# Patient Record
Sex: Female | Born: 1945
Health system: Southern US, Community
[De-identification: ages and names within clinical notes are randomized; demographics above are authoritative.]

## PROBLEM LIST (undated history)

## (undated) DIAGNOSIS — Z79890 Hormone replacement therapy (postmenopausal): Secondary | ICD-10-CM

## (undated) DIAGNOSIS — E119 Type 2 diabetes mellitus without complications: Secondary | ICD-10-CM

## (undated) DIAGNOSIS — I10 Essential (primary) hypertension: Secondary | ICD-10-CM

## (undated) DIAGNOSIS — K8689 Other specified diseases of pancreas: Secondary | ICD-10-CM

## (undated) DIAGNOSIS — D136 Benign neoplasm of pancreas: Secondary | ICD-10-CM

## (undated) DIAGNOSIS — H6123 Impacted cerumen, bilateral: Secondary | ICD-10-CM

## (undated) DIAGNOSIS — K869 Disease of pancreas, unspecified: Secondary | ICD-10-CM

## (undated) DIAGNOSIS — R112 Nausea with vomiting, unspecified: Secondary | ICD-10-CM

## (undated) DIAGNOSIS — R7302 Impaired glucose tolerance (oral): Secondary | ICD-10-CM

## (undated) DIAGNOSIS — R197 Diarrhea, unspecified: Secondary | ICD-10-CM

## (undated) DIAGNOSIS — K625 Hemorrhage of anus and rectum: Secondary | ICD-10-CM

## (undated) DIAGNOSIS — Z9889 Other specified postprocedural states: Secondary | ICD-10-CM

## (undated) DIAGNOSIS — Z780 Asymptomatic menopausal state: Secondary | ICD-10-CM

## (undated) DIAGNOSIS — E785 Hyperlipidemia, unspecified: Secondary | ICD-10-CM

## (undated) DIAGNOSIS — J029 Acute pharyngitis, unspecified: Secondary | ICD-10-CM

## (undated) DIAGNOSIS — G43909 Migraine, unspecified, not intractable, without status migrainosus: Secondary | ICD-10-CM

## (undated) DIAGNOSIS — E7800 Pure hypercholesterolemia, unspecified: Secondary | ICD-10-CM

## (undated) DIAGNOSIS — Z460 Encounter for fitting and adjustment of spectacles and contact lenses: Secondary | ICD-10-CM

## (undated) DIAGNOSIS — Z5189 Encounter for other specified aftercare: Secondary | ICD-10-CM

## (undated) DIAGNOSIS — R319 Hematuria, unspecified: Secondary | ICD-10-CM

## (undated) HISTORY — DX: Pure hypercholesterolemia, unspecified: E78.00

## (undated) HISTORY — DX: Hormone replacement therapy: Z79.890

## (undated) HISTORY — DX: Benign neoplasm of pancreas: D13.6

## (undated) HISTORY — PX: COLONOSCOPY: SHX174

## (undated) HISTORY — DX: Hyperlipidemia, unspecified: E78.5

## (undated) HISTORY — DX: Hemorrhage of anus and rectum: K62.5

## (undated) HISTORY — DX: Encounter for other specified aftercare: Z51.89

## (undated) HISTORY — DX: Disease of pancreas, unspecified: K86.9

## (undated) HISTORY — DX: Migraine, unspecified, not intractable, without status migrainosus: G43.909

## (undated) HISTORY — DX: Impaired glucose tolerance (oral): R73.02

## (undated) HISTORY — DX: Essential (primary) hypertension: I10

## (undated) HISTORY — DX: Hematuria, unspecified: R31.9

## (undated) HISTORY — DX: Impacted cerumen, bilateral: H61.23

## (undated) HISTORY — DX: Acute pharyngitis, unspecified: J02.9

## (undated) HISTORY — DX: Other specified diseases of pancreas: K86.89

## (undated) HISTORY — PX: OVARIAN CYST REMOVAL: SHX89

## (undated) HISTORY — DX: Type 2 diabetes mellitus without complications: E11.9

## (undated) HISTORY — DX: Diarrhea, unspecified: R19.7

## (undated) HISTORY — PX: SPLENECTOMY: SUR1306

## (undated) HISTORY — DX: Encounter for fitting and adjustment of spectacles and contact lenses: Z46.0

## (undated) HISTORY — DX: Asymptomatic menopausal state: Z78.0

---
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
## 1998-01-19 ENCOUNTER — Other Ambulatory Visit: Admission: RE | Admit: 1998-01-19 | Discharge: 1998-01-19 | Payer: Self-pay | Admitting: *Deleted

## 1999-01-04 ENCOUNTER — Other Ambulatory Visit: Admission: RE | Admit: 1999-01-04 | Discharge: 1999-01-04 | Payer: Self-pay | Admitting: *Deleted

## 2000-01-05 ENCOUNTER — Other Ambulatory Visit: Admission: RE | Admit: 2000-01-05 | Discharge: 2000-01-05 | Payer: Self-pay | Admitting: *Deleted

## 2001-02-18 ENCOUNTER — Other Ambulatory Visit: Admission: RE | Admit: 2001-02-18 | Discharge: 2001-02-18 | Payer: Self-pay | Admitting: *Deleted

## 2002-05-26 ENCOUNTER — Other Ambulatory Visit: Admission: RE | Admit: 2002-05-26 | Discharge: 2002-05-26 | Payer: Self-pay | Admitting: *Deleted

## 2003-06-08 ENCOUNTER — Other Ambulatory Visit: Admission: RE | Admit: 2003-06-08 | Discharge: 2003-06-08 | Payer: Self-pay | Admitting: *Deleted

## 2004-09-13 ENCOUNTER — Other Ambulatory Visit: Admission: RE | Admit: 2004-09-13 | Discharge: 2004-09-13 | Payer: Self-pay | Admitting: *Deleted

## 2005-12-05 ENCOUNTER — Other Ambulatory Visit: Admission: RE | Admit: 2005-12-05 | Discharge: 2005-12-05 | Payer: Self-pay | Admitting: *Deleted

## 2007-02-26 ENCOUNTER — Other Ambulatory Visit: Admission: RE | Admit: 2007-02-26 | Discharge: 2007-02-26 | Payer: Self-pay | Admitting: *Deleted

## 2008-02-27 ENCOUNTER — Encounter: Payer: Self-pay | Admitting: Obstetrics and Gynecology

## 2008-02-27 ENCOUNTER — Ambulatory Visit: Payer: Self-pay | Admitting: Obstetrics and Gynecology

## 2008-02-27 ENCOUNTER — Other Ambulatory Visit: Admission: RE | Admit: 2008-02-27 | Discharge: 2008-02-27 | Payer: Self-pay | Admitting: Obstetrics and Gynecology

## 2008-03-02 ENCOUNTER — Ambulatory Visit: Payer: Self-pay | Admitting: Obstetrics and Gynecology

## 2010-06-20 ENCOUNTER — Ambulatory Visit
Admission: RE | Admit: 2010-06-20 | Discharge: 2010-06-20 | Payer: Self-pay | Source: Home / Self Care | Attending: Obstetrics and Gynecology | Admitting: Obstetrics and Gynecology

## 2010-06-20 ENCOUNTER — Other Ambulatory Visit
Admission: RE | Admit: 2010-06-20 | Discharge: 2010-06-20 | Payer: Self-pay | Source: Home / Self Care | Admitting: Obstetrics and Gynecology

## 2011-04-18 ENCOUNTER — Other Ambulatory Visit: Payer: Self-pay | Admitting: Family Medicine

## 2011-04-18 DIAGNOSIS — K869 Disease of pancreas, unspecified: Secondary | ICD-10-CM

## 2011-04-25 ENCOUNTER — Ambulatory Visit
Admission: RE | Admit: 2011-04-25 | Discharge: 2011-04-25 | Disposition: A | Discharge status: Home / Self Care | Payer: Medicare Other | Source: Ambulatory Visit | Attending: Family Medicine | Admitting: Family Medicine

## 2011-04-25 DIAGNOSIS — K869 Disease of pancreas, unspecified: Secondary | ICD-10-CM

## 2011-04-25 MED ORDER — GADOBENATE DIMEGLUMINE 529 MG/ML IV SOLN: 15.0000 mL | Freq: Once | INTRAVENOUS | Status: AC | PRN

## 2011-05-17 ENCOUNTER — Encounter (INDEPENDENT_AMBULATORY_CARE_PROVIDER_SITE_OTHER): Payer: Self-pay

## 2011-05-18 ENCOUNTER — Ambulatory Visit (INDEPENDENT_AMBULATORY_CARE_PROVIDER_SITE_OTHER): Payer: Medicare Other | Admitting: General Surgery

## 2011-05-22 ENCOUNTER — Encounter (INDEPENDENT_AMBULATORY_CARE_PROVIDER_SITE_OTHER): Payer: Self-pay

## 2011-05-23 ENCOUNTER — Other Ambulatory Visit (INDEPENDENT_AMBULATORY_CARE_PROVIDER_SITE_OTHER): Payer: Self-pay | Admitting: General Surgery

## 2011-05-23 ENCOUNTER — Encounter (INDEPENDENT_AMBULATORY_CARE_PROVIDER_SITE_OTHER): Payer: Self-pay | Admitting: General Surgery

## 2011-05-23 ENCOUNTER — Ambulatory Visit (INDEPENDENT_AMBULATORY_CARE_PROVIDER_SITE_OTHER): Payer: Medicare Other | Admitting: General Surgery

## 2011-05-23 DIAGNOSIS — K649 Unspecified hemorrhoids: Secondary | ICD-10-CM | POA: Insufficient documentation

## 2011-05-23 DIAGNOSIS — D136 Benign neoplasm of pancreas: Secondary | ICD-10-CM

## 2011-05-23 DIAGNOSIS — K8689 Other specified diseases of pancreas: Secondary | ICD-10-CM

## 2011-05-23 DIAGNOSIS — C259 Malignant neoplasm of pancreas, unspecified: Secondary | ICD-10-CM

## 2011-05-23 DIAGNOSIS — K869 Disease of pancreas, unspecified: Secondary | ICD-10-CM

## 2011-05-23 HISTORY — DX: Benign neoplasm of pancreas: D13.6

## 2011-05-23 MED ORDER — HYDROCORTISONE 2.5 % RE CREA: TOPICAL_CREAM | Freq: Two times a day (BID) | RECTAL | Status: AC

## 2011-05-23 NOTE — Progress Notes (Signed)
Chief Complaint  Patient presents with  . Other    Eval pancreatic mass    HISTORY: Pt presented with what she thought was hematuria.  She was seen by urology and workup negative for issues with kidneys or bladder.  She underwent a CT, and was found to have a pancreatic body mass.  She has had no abdominal pain, no nausea, vomiting, early satiety, no glucose intolerance, no weight loss.  She has not has any problems with diarrhea.  She now thinks that maybe her hemorrhoids may have made her think she had hematuria.  She has undergone an MR to eval the mass, and this was 2.7 cystic lesion in the body.  This favors a mucinous cystic neoplasm.    Past Medical History  Diagnosis Date  . Hyperlipidemia   . Hypertension   . Hypercholesterolemia   . Pharyngitis, acute   . Pancreatic lesion     found on MRI  . Rectal bleeding   . Hemorrhoids   . Blood in urine   . Contact lens/glasses fitting   . Menopause   . Hormone replacement therapy (postmenopausal)   . Pancreatic mass     Past Surgical History  Procedure Date  . Ovarian cyst removal 2002 - approximate  . Tonsillectomy 1952  . Colonoscopy     Current Outpatient Prescriptions  Medication Sig Dispense Refill  . Calcium Citrate-Vitamin D (CALCIUM CITRATE + D PO) Take by mouth as needed. Vitamin d 500 iu; calcium 630 mg       . Multiple Vitamins-Minerals (CENTURY SENIOR PO) Take by mouth as needed.        . hydrochlorothiazide (HYDRODIURIL) 25 MG tablet Take 25 mg by mouth daily.        Marland Kitchen lisinopril (PRINIVIL,ZESTRIL) 40 MG tablet Take 40 mg by mouth daily.        . simvastatin (ZOCOR) 40 MG tablet Take 40 mg by mouth at bedtime.           Allergies  Allergen Reactions  . Codeine Nausea And Vomiting  . Sulfa Antibiotics Hives  . Penicillins Other (See Comments)    Told since she was a child not to take.     Family History  Problem Relation Age of Onset  . Cancer Mother     pancreatic  . Other Father     respiration  problems, bleeding ulcers, cardiac     History   Social History  . Marital Status: Widowed    Spouse Name: N/A    Number of Children: N/A  . Years of Education: N/A   Social History Main Topics  . Smoking status: Never Smoker   . Smokeless tobacco: Never Used  . Alcohol Use: Yes     rarely  . Drug Use: No  . Sexually Active: None   Other Topics Concern  . None   Social History Narrative  . None   REVIEW OF SYSTEMS - PERTINENT POSITIVES ONLY: 12 point review of systems negative other than HPI and PMH except for rectal bleeding.    EXAM: Filed Vitals:   05/23/11 1330  BP: 106/74  Pulse: 68  Temp: 97.9 F (36.6 C)  Resp: 18    Gen:  No acute distress.  Well nourished and well groomed.   Neurological: Alert and oriented to person, place, and time. Coordination normal.  Head: Normocephalic and atraumatic.  Eyes: Conjunctivae are normal. Pupils are equal, round, and reactive to light. No scleral icterus.  Neck: Normal range of  motion. Neck supple. No tracheal deviation or thyromegaly present.  Cardiovascular: Normal rate, regular rhythm, normal heart sounds and intact distal pulses.  Exam reveals no gallop and no friction rub.  No murmur heard. Respiratory: Effort normal.  No respiratory distress. No chest wall tenderness. Breath sounds normal.  No wheezes, rales or rhonchi.  GI: Soft. Bowel sounds are normal. The abdomen is soft and nontender.  There is no rebound and no guarding.  Musculoskeletal: Normal range of motion. Extremities are nontender.  Lymphadenopathy: No cervical, preauricular, postauricular or axillary adenopathy is present Skin: Skin is warm and dry. No rash noted. No diaphoresis. No erythema. No pallor. No clubbing, cyanosis, or edema.   Psychiatric: Normal mood and affect. Behavior is normal. Judgment and thought content normal.    LABORATORY RESULTS: Available labs are reviewed    RADIOLOGY RESULTS: MR IMPRESSION:  2.7 cm nonenhancing cystic  lesion in the pancreatic body, as  described above. The appearance favors a mucinous cystic neoplasm,  less likely a pseudocyst if there is documented prior history of  pancreatitis.  Pancreatic divisum.  Cholelithiasis, without associated findings to suggest acute  cholecystitis.   ASSESSMENT AND PLAN: Pancreatic mass Cystic mass of pancreas with septations. Mother of pt had pancreatic cancer. This was discovered incidentally, pt asymptomatic. Tumor markers, referral to GI for EUS. If high fluid amylase, this would be consistent with pseudocyst, and we could watch. If low fluid amylase, high CEA, or atypical cytology, we would remove distal pancreas.    Will follow up to discuss    Maudry Diego MD Surgical Oncology, General and Endocrine Surgery [facility]   Visit Diagnoses: 1. Pancreatic mass   2. Hemorrhoids     Primary Care Physician: Gretel Acre, MD

## 2011-05-23 NOTE — Patient Instructions (Addendum)
Follow up after EUS appointment.  If EUS is after our appt, call and move appointment with me.     Hemorrhoids  Hemorrhoids are enlarged (dilated) veins around the rectum. There are 2 types of hemorrhoids, and the type of hemorrhoid is determined by its location. Internal hemorrhoids occur in the veins just inside the rectum.They are usually not painful, but they may bleed.However, they may poke through to the outside and become irritated and painful. External hemorrhoids involve the veins outside the anus and can be felt as a painful swelling or hard lump near the anus.They are often itchy and may crack and bleed. Sometimes clots will form in the veins. This makes them swollen and painful. These are called thrombosed hemorrhoids. CAUSES Causes of hemorrhoids include:  Pregnancy. This increases the pressure in the hemorrhoidal veins.   Constipation.   Straining to have a bowel movement.   Obesity.   Heavy lifting or other activity that caused you to strain.   TREATMENT Most of the time hemorrhoids improve in 1 to 2 weeks. However, if symptoms do not seem to be getting better or if you have a lot of rectal bleeding, your caregiver may perform a procedure to help make the hemorrhoids get smaller or remove them completely.Possible treatments include:  Rubber band ligation. A rubber band is placed at the base of the hemorrhoid to cut off the circulation.   Sclerotherapy. A chemical is injected to shrink the hemorrhoid.   Infrared light therapy. Tools are used to burn the hemorrhoid.   Hemorrhoidectomy. This is surgical removal of the hemorrhoid.   HOME CARE INSTRUCTIONS   Increase fiber in your diet. Ask your caregiver about using fiber supplements.   Drink enough water and fluids to keep your urine clear or pale yellow.   Exercise regularly.   Go to the bathroom when you have the urge to have a bowel movement. Do not wait.   Avoid straining to have bowel movements.   Keep  the anal area dry and clean.   Only take over-the-counter or prescription medicines for pain, discomfort, or fever as directed by your caregiver.   Take warm sitz baths for 20 to 30 minutes, 3 to 4 times per day.   If the hemorrhoids are very tender and swollen, place ice packs on the area as tolerated. Using ice packs between sitz baths may be helpful. Fill a plastic bag with ice. Place a towel between the bag of ice and your skin.   Medicated creams and suppositories may be used or applied as directed.   Do not use a donut-shaped pillow or sit on the toilet for long periods. This increases blood pooling and pain.   SEEK MEDICAL CARE IF:   You have increasing pain and swelling that is not controlled with your medicine.   You have uncontrolled bleeding.   You have difficulty or you are unable to have a bowel movement.   You have pain or inflammation outside the area of the hemorrhoids.   You have chills or an oral temperature above 102 F (38.9 C).

## 2011-05-23 NOTE — Assessment & Plan Note (Signed)
Cystic mass of pancreas with septations. Mother of pt had pancreatic cancer. This was discovered incidentally, pt asymptomatic. Tumor markers, referral to GI for EUS. If high fluid amylase, this would be consistent with pseudocyst, and we could watch. If low fluid amylase, high CEA, or atypical cytology, we would remove distal pancreas.    Will follow up to discuss

## 2011-05-24 NOTE — Progress Notes (Signed)
Quick Note:  Please let pt know value of tumor marker is low, which is good. ______

## 2011-05-25 ENCOUNTER — Other Ambulatory Visit: Payer: Self-pay | Admitting: Gastroenterology

## 2011-05-25 ENCOUNTER — Telehealth: Payer: Self-pay

## 2011-05-25 DIAGNOSIS — K862 Cyst of pancreas: Secondary | ICD-10-CM

## 2011-05-25 NOTE — Telephone Encounter (Signed)
Message copied by Donata Duff on Thu May 25, 2011 11:09 AM ------      Message from: Rob Bunting P      Created: Thu May 25, 2011 10:25 AM      Regarding: RE: pt need EUS scheduled       OK,      Almena Hokenson, please schedule her for upper EUS radial +/- linear, , next  Available EUS time at [facility] (does not need propofol).  For pancreatic cyst.  Thanks                        ----- Message -----         From: Chales Abrahams, CMA         Sent: 05/24/2011   8:08 AM           To: Rob Bunting, MD      Subject: FW: pt need EUS scheduled                                            ----- Message -----         From: Erin Sons         Sent: 05/23/2011   4:51 PM           To: Chales Abrahams, CMA      Subject: pt need EUS scheduled                                    Pt need EUS appt scheduled for panc mass.            Thanks      Erin Sons

## 2011-05-25 NOTE — Telephone Encounter (Signed)
Left message on machine to call back  

## 2011-05-25 NOTE — Telephone Encounter (Signed)
Pt has been scheduled for a EUS need to explain and review meds

## 2011-05-25 NOTE — Telephone Encounter (Signed)
Pt aware she has been instructed and meds reviewed.  She will call with any further questions or concerns

## 2011-06-01 ENCOUNTER — Telehealth (INDEPENDENT_AMBULATORY_CARE_PROVIDER_SITE_OTHER): Payer: Self-pay

## 2011-06-01 NOTE — Telephone Encounter (Signed)
Pt aware of cancer antigen results.  She has appt with Dr. Christella Hartigan for her endoscopy.

## 2011-06-08 ENCOUNTER — Encounter (INDEPENDENT_AMBULATORY_CARE_PROVIDER_SITE_OTHER): Payer: Self-pay

## 2011-06-14 ENCOUNTER — Encounter (HOSPITAL_COMMUNITY): Payer: Self-pay | Admitting: *Deleted

## 2011-06-15 ENCOUNTER — Ambulatory Visit (HOSPITAL_COMMUNITY)
Admission: RE | Admit: 2011-06-15 | Discharge: 2011-06-15 | Disposition: A | Discharge status: Home / Self Care | Payer: Medicare Other | Source: Ambulatory Visit | Attending: Gastroenterology | Admitting: Gastroenterology

## 2011-06-15 ENCOUNTER — Encounter (HOSPITAL_COMMUNITY): Payer: Self-pay

## 2011-06-15 ENCOUNTER — Other Ambulatory Visit: Payer: Self-pay | Admitting: Gastroenterology

## 2011-06-15 ENCOUNTER — Encounter (HOSPITAL_COMMUNITY)
Admission: RE | Disposition: A | Discharge status: Home / Self Care | Payer: Self-pay | Source: Ambulatory Visit | Attending: Gastroenterology

## 2011-06-15 DIAGNOSIS — K862 Cyst of pancreas: Secondary | ICD-10-CM | POA: Insufficient documentation

## 2011-06-15 DIAGNOSIS — K863 Pseudocyst of pancreas: Secondary | ICD-10-CM | POA: Insufficient documentation

## 2011-06-15 DIAGNOSIS — Z79899 Other long term (current) drug therapy: Secondary | ICD-10-CM | POA: Insufficient documentation

## 2011-06-15 DIAGNOSIS — I10 Essential (primary) hypertension: Secondary | ICD-10-CM | POA: Insufficient documentation

## 2011-06-15 DIAGNOSIS — K869 Disease of pancreas, unspecified: Secondary | ICD-10-CM

## 2011-06-15 DIAGNOSIS — E785 Hyperlipidemia, unspecified: Secondary | ICD-10-CM | POA: Insufficient documentation

## 2011-06-15 DIAGNOSIS — E7800 Pure hypercholesterolemia, unspecified: Secondary | ICD-10-CM | POA: Insufficient documentation

## 2011-06-15 DIAGNOSIS — K449 Diaphragmatic hernia without obstruction or gangrene: Secondary | ICD-10-CM | POA: Insufficient documentation

## 2011-06-15 DIAGNOSIS — K8689 Other specified diseases of pancreas: Secondary | ICD-10-CM

## 2011-06-15 HISTORY — DX: Other specified postprocedural states: R11.2

## 2011-06-15 HISTORY — DX: Other specified postprocedural states: Z98.890

## 2011-06-15 HISTORY — PX: EUS: SHX5427

## 2011-06-15 SURGERY — UPPER ENDOSCOPIC ULTRASOUND (EUS) LINEAR
Anesthesia: Moderate Sedation

## 2011-06-15 MED ORDER — BUTAMBEN-TETRACAINE-BENZOCAINE 2-2-14 % EX AERO
INHALATION_SPRAY | CUTANEOUS | Status: DC | PRN
  Administered 2011-06-15: 2 via TOPICAL

## 2011-06-15 MED ORDER — FENTANYL CITRATE 0.05 MG/ML IJ SOLN
INTRAMUSCULAR | Status: AC
  Filled 2011-06-15: qty 2

## 2011-06-15 MED ORDER — CIPROFLOXACIN IN D5W 400 MG/200ML IV SOLN: 400.0000 mg | Freq: Two times a day (BID) | INTRAVENOUS | Status: DC

## 2011-06-15 MED ORDER — CIPROFLOXACIN IN D5W 400 MG/200ML IV SOLN
INTRAVENOUS | Status: AC
  Filled 2011-06-15: qty 200

## 2011-06-15 MED ORDER — FENTANYL CITRATE 0.05 MG/ML IJ SOLN
INTRAMUSCULAR | Status: DC | PRN
  Administered 2011-06-15: 10 ug via INTRAVENOUS
  Administered 2011-06-15 (×2): 25 ug via INTRAVENOUS
  Administered 2011-06-15: 15 ug via INTRAVENOUS

## 2011-06-15 MED ORDER — DIPHENHYDRAMINE HCL 50 MG/ML IJ SOLN
INTRAMUSCULAR | Status: AC
  Filled 2011-06-15: qty 1

## 2011-06-15 MED ORDER — MIDAZOLAM HCL 10 MG/2ML IJ SOLN
INTRAMUSCULAR | Status: DC | PRN
  Administered 2011-06-15: 1.5 mg via INTRAVENOUS
  Administered 2011-06-15 (×2): 2 mg via INTRAVENOUS
  Administered 2011-06-15 (×2): 1 mg via INTRAVENOUS

## 2011-06-15 MED ORDER — METRONIDAZOLE 500 MG PO TABS: 500.0000 mg | ORAL_TABLET | Freq: Three times a day (TID) | ORAL | Status: AC

## 2011-06-15 MED ORDER — MIDAZOLAM HCL 2 MG/2ML IJ SOLN
INTRAMUSCULAR | Status: AC
  Filled 2011-06-15: qty 2

## 2011-06-15 MED ORDER — MIDAZOLAM HCL 10 MG/2ML IJ SOLN
INTRAMUSCULAR | Status: AC
  Filled 2011-06-15: qty 2

## 2011-06-15 MED ORDER — CIPROFLOXACIN IN D5W 400 MG/200ML IV SOLN
400.0000 mg | Freq: Once | INTRAVENOUS | Status: AC
  Administered 2011-06-15: 400 mg via INTRAVENOUS

## 2011-06-15 MED ORDER — DIPHENHYDRAMINE HCL 50 MG/ML IJ SOLN: 25.0000 mg | Freq: Once | INTRAMUSCULAR | Status: DC

## 2011-06-15 MED ORDER — SODIUM CHLORIDE 0.9 % IV SOLN
INTRAVENOUS | Status: DC
  Administered 2011-06-15 (×2): 500 mL via INTRAVENOUS

## 2011-06-15 MED ORDER — CIPROFLOXACIN HCL 500 MG PO TABS: 500.0000 mg | ORAL_TABLET | Freq: Two times a day (BID) | ORAL | Status: DC

## 2011-06-15 NOTE — Interval H&P Note (Signed)
History and Physical Interval Note:  06/15/2011 2:47 PM  Valerie Lawson  has presented today for surgery, with the diagnosis of Pancreatic cyst [577.2]  The various methods of treatment have been discussed with the patient and family. After consideration of risks, benefits and other options for treatment, the patient has consented to  Procedure(s): UPPER ENDOSCOPIC ULTRASOUND (EUS) LINEAR as a surgical intervention .  The patients' history has been reviewed, patient examined, no change in status, stable for surgery.  I have reviewed the patients' chart and labs.  Questions were answered to the patient's satisfaction.     Rob Bunting

## 2011-06-15 NOTE — H&P (View-Only) (Signed)
Chief Complaint  Patient presents with  . Other    Eval pancreatic mass    HISTORY: Pt presented with what she thought was hematuria.  She was seen by urology and workup negative for issues with kidneys or bladder.  She underwent a CT, and was found to have a pancreatic body mass.  She has had no abdominal pain, no nausea, vomiting, early satiety, no glucose intolerance, no weight loss.  She has not has any problems with diarrhea.  She now thinks that maybe her hemorrhoids may have made her think she had hematuria.  She has undergone an MR to eval the mass, and this was 2.7 cystic lesion in the body.  This favors a mucinous cystic neoplasm.    Past Medical History  Diagnosis Date  . Hyperlipidemia   . Hypertension   . Hypercholesterolemia   . Pharyngitis, acute   . Pancreatic lesion     found on MRI  . Rectal bleeding   . Hemorrhoids   . Blood in urine   . Contact lens/glasses fitting   . Menopause   . Hormone replacement therapy (postmenopausal)   . Pancreatic mass     Past Surgical History  Procedure Date  . Ovarian cyst removal 2002 - approximate  . Tonsillectomy 1952  . Colonoscopy     Current Outpatient Prescriptions  Medication Sig Dispense Refill  . Calcium Citrate-Vitamin D (CALCIUM CITRATE + D PO) Take by mouth as needed. Vitamin d 500 iu; calcium 630 mg       . Multiple Vitamins-Minerals (CENTURY SENIOR PO) Take by mouth as needed.        . hydrochlorothiazide (HYDRODIURIL) 25 MG tablet Take 25 mg by mouth daily.        . lisinopril (PRINIVIL,ZESTRIL) 40 MG tablet Take 40 mg by mouth daily.        . simvastatin (ZOCOR) 40 MG tablet Take 40 mg by mouth at bedtime.           Allergies  Allergen Reactions  . Codeine Nausea And Vomiting  . Sulfa Antibiotics Hives  . Penicillins Other (See Comments)    Told since she was a child not to take.     Family History  Problem Relation Age of Onset  . Cancer Mother     pancreatic  . Other Father     respiration  problems, bleeding ulcers, cardiac     History   Social History  . Marital Status: Widowed    Spouse Name: N/A    Number of Children: N/A  . Years of Education: N/A   Social History Main Topics  . Smoking status: Never Smoker   . Smokeless tobacco: Never Used  . Alcohol Use: Yes     rarely  . Drug Use: No  . Sexually Active: None   Other Topics Concern  . None   Social History Narrative  . None   REVIEW OF SYSTEMS - PERTINENT POSITIVES ONLY: 12 point review of systems negative other than HPI and PMH except for rectal bleeding.    EXAM: Filed Vitals:   05/23/11 1330  BP: 106/74  Pulse: 68  Temp: 97.9 F (36.6 C)  Resp: 18    Gen:  No acute distress.  Well nourished and well groomed.   Neurological: Alert and oriented to person, place, and time. Coordination normal.  Head: Normocephalic and atraumatic.  Eyes: Conjunctivae are normal. Pupils are equal, round, and reactive to light. No scleral icterus.  Neck: Normal range of   motion. Neck supple. No tracheal deviation or thyromegaly present.  Cardiovascular: Normal rate, regular rhythm, normal heart sounds and intact distal pulses.  Exam reveals no gallop and no friction rub.  No murmur heard. Respiratory: Effort normal.  No respiratory distress. No chest wall tenderness. Breath sounds normal.  No wheezes, rales or rhonchi.  GI: Soft. Bowel sounds are normal. The abdomen is soft and nontender.  There is no rebound and no guarding.  Musculoskeletal: Normal range of motion. Extremities are nontender.  Lymphadenopathy: No cervical, preauricular, postauricular or axillary adenopathy is present Skin: Skin is warm and dry. No rash noted. No diaphoresis. No erythema. No pallor. No clubbing, cyanosis, or edema.   Psychiatric: Normal mood and affect. Behavior is normal. Judgment and thought content normal.    LABORATORY RESULTS: Available labs are reviewed    RADIOLOGY RESULTS: MR IMPRESSION:  2.7 cm nonenhancing cystic  lesion in the pancreatic body, as  described above. The appearance favors a mucinous cystic neoplasm,  less likely a pseudocyst if there is documented prior history of  pancreatitis.  Pancreatic divisum.  Cholelithiasis, without associated findings to suggest acute  cholecystitis.   ASSESSMENT AND PLAN: Pancreatic mass Cystic mass of pancreas with septations. Mother of pt had pancreatic cancer. This was discovered incidentally, pt asymptomatic. Tumor markers, referral to GI for EUS. If high fluid amylase, this would be consistent with pseudocyst, and we could watch. If low fluid amylase, high CEA, or atypical cytology, we would remove distal pancreas.    Will follow up to discuss    Dervin Vore L Malaijah Houchen MD Surgical Oncology, General and Endocrine Surgery [facility]   Visit Diagnoses: 1. Pancreatic mass   2. Hemorrhoids     Primary Care Physician: Adaku Nnodi, MD    

## 2011-06-15 NOTE — Op Note (Signed)
[facility] [address] [place], [place]  [address]  ENDOSCOPIC ULTRASOUND PROCEDURE REPORT  PATIENT:  Valerie Lawson, Valerie Lawson  MR#:  [id number] BIRTHDATE:  [birth year]  GENDER:  female ENDOSCOPIST:  Rachael Fee, MD REFERRED BY:  Almond Lint, M.D. PROCEDURE DATE:  06/15/2011 PROCEDURE:  Upper EUS w/FNA ASA CLASS:  Class II INDICATIONS:  incidentally noted cyst in body of pancreas; mother died of pancreatic cancer MEDICATIONS:  Fentanyl 75 mcg IV, Versed 7.5 mg IV, cipro 400mg  IV DESCRIPTION OF PROCEDURE:   After the risks benefits and alternatives of the procedure were  explained, informed consent was obtained. The patient was then placed in the left, lateral, decubitus postion and IV sedation was administered. Throughout the procedure, the patient's blood pressure, pulse and oxygen saturations were monitored continuously.  Under direct visualization, the  endoscope was introduced through the mouth and advanced to the second portion of the duodenum.  Water was used as necessary to provide an acoustic interface.  Upon completion of the imaging, water was removed and the patient was sent to the recovery room in satisfactory condition. <<PROCEDUREIMAGES>>  Endoscopic findings: 1. Small hiatal hernia 2. Otherwise normal UGI tract (limited views with echoendoscopes)  EUS findings: 1. 2.7cm by 2.0cm anechoic cyst in body of pancreas that does not appear to communicate with the main pancreatic duct. There are no associated solid masses or nodules and no septea within the cyst. The fluid was completely aspirated using a single pass with a 22 gauge BS EUS FNA needle.  6cc of clear, thin fluid was aspirated and sent for review. 2. Pancreatic parenchyma was otherwise normal 3. Main pancreatic duct was normal, non-dilated 4. Shadowing stones are within the gallbladder 5. CBD was normal; non-dilated and no stones.  Impression: 2.7cm by 2.0cm simple cyst in body of pancreas  without signs of malignancy.  The fluid from the cyst was completely aspirated and sent for CEA, amylase and cytology.  If the fluid is clearly innocent (negative cytology, low CEA) then would simply follow her clinically. If the fluid is clearly concerning or even if "gray area" I would recommend resection given her family history (mother had pancreatic cancer).  She will complete 3 days of cipro.  ______________________________ Rachael Fee, MD  n. eSIGNED:   Rachael Fee at 06/15/2011 03:49 PM  Dedra Skeens, [id number]

## 2011-06-16 ENCOUNTER — Encounter (HOSPITAL_COMMUNITY): Payer: Self-pay

## 2011-06-16 ENCOUNTER — Encounter (HOSPITAL_COMMUNITY): Payer: Self-pay | Admitting: Gastroenterology

## 2011-06-16 LAB — PANC CYST FLD ANLYS-PATHFNDR-TG

## 2011-06-23 ENCOUNTER — Telehealth: Payer: Self-pay | Admitting: Gastroenterology

## 2011-06-23 NOTE — Telephone Encounter (Signed)
i have left messages the past 2 days.  No call back  CEA 4.5 ng/mL Amylase 14,918 U/L Cytology; "some atypical cells"  Given her family history, mother died of panc cancer, Dr. Donell Beers and I agree that she should consider resection since there were some atypical cells in the cytology.  I hope to discuss this with her soon, await her call back.

## 2011-06-23 NOTE — Telephone Encounter (Signed)
We spoke about the cyst fluid test results. She understands that Dr. Arita Miss office will be getting in touch with her to discuss surgical resection.

## 2011-06-27 ENCOUNTER — Telehealth (INDEPENDENT_AMBULATORY_CARE_PROVIDER_SITE_OTHER): Payer: Self-pay

## 2011-06-27 ENCOUNTER — Encounter (INDEPENDENT_AMBULATORY_CARE_PROVIDER_SITE_OTHER): Payer: Medicare Other | Admitting: General Surgery

## 2011-06-27 ENCOUNTER — Encounter: Payer: Self-pay | Admitting: Gastroenterology

## 2011-06-29 NOTE — Telephone Encounter (Signed)
Close encounter 

## 2011-07-03 ENCOUNTER — Ambulatory Visit (INDEPENDENT_AMBULATORY_CARE_PROVIDER_SITE_OTHER): Payer: Medicare Other | Admitting: General Surgery

## 2011-07-03 ENCOUNTER — Encounter (INDEPENDENT_AMBULATORY_CARE_PROVIDER_SITE_OTHER): Payer: Self-pay | Admitting: General Surgery

## 2011-07-03 DIAGNOSIS — K8689 Other specified diseases of pancreas: Secondary | ICD-10-CM

## 2011-07-03 DIAGNOSIS — K869 Disease of pancreas, unspecified: Secondary | ICD-10-CM

## 2011-07-03 DIAGNOSIS — K8020 Calculus of gallbladder without cholecystitis without obstruction: Secondary | ICD-10-CM | POA: Insufficient documentation

## 2011-07-03 NOTE — Assessment & Plan Note (Signed)
Plan lap chole in conjunction with above procedure.  The surgical procedure was described to the patient in detail.  The patient was given Agricultural engineer. .  I discussed the incision type and location, the location of the gallbladder, the anatomy of the bile ducts and arteries, and the typical progression of surgery.  I discussed the possibility of converting to an open operation.  I advised of the risks of bleeding, infection, damage to other structures (such as the bile duct, intestine or liver), bile leak, need for other procedures or surgeries, and post op diarrhea/constipation.  We discussed the risk of blood clot.  We discussed the recovery period and post operative restrictions.  The patient was advised against taking blood thinners the week before surgery.

## 2011-07-03 NOTE — Patient Instructions (Signed)
Pancreas surgery.  Care After Refer to this sheet in the next few weeks. These instructions provide you with information on caring for yourself after your procedure. Your caregiver may also give you more specific instructions. Your treatment has been planned according to current medical practices, but problems sometimes occur. Call your caregiver if you have any problems or questions after your procedure.  POSSIBLE COMPLICATIONS This is a very extensive operation and includes complications listed below: Bleeding Infection and possible wound complications such as hernia Damage to adjacent structures Leak of surgical connections, the worst of which is the connection between the intestine and the pancreas (20%) Possible need for other procedures, such as abscess drains in radiology or endoscopy.   Possible prolonged hospital stay Possible development of diabetes or worsening of current diabetes. (5-20%) Possible diarrhea from lack of pancreatic enzymes.   Prolonged fatigue/weakness/appetite MOST PATIENTS' ENERGY LEVEL IS NOT BACK TO NORMAL FOR AT LEAST 2-3 MONTHS.  OLDER PATIENTS MAY FEEL WEAK FOR LONGER PERIODS OF TIME.   Difficulty with eating or post operative nausea (around 30%) Possible early recurrence of cancer Possible complications of your medical problems such as heart disease or arrhythmias.  HOME CARE INSTRUCTIONS  Medicines  Take pain medicine as prescribed by your caregiver. Do not take any over-the-counter pain medicines unless your caregiver says it is okay. Some pain medicines can cause bleeding for several weeks after surgery.   Constipation is common after this procedure. You may need to take medicine to prevent this.   Some people have trouble digesting food after a  Partial pancreatectomy. Your caregiver may give you medicine to help with digestion. Let us know if you are having diarrhea.    Wound care  You may have drainage tubes still in place when you go home.  These tubes need to stay in place until no more fluid is draining from your body. Your caregiver will explain what you need to do. Follow the instructions carefully. Note the daily amount of drainage and color of the fluid in the drain. Be sure to ask when you should return to have the tubes taken out.   You may need to go back to have your stitches (sutures) or staples taken out. Make sure you know when to do that.   Carefully check your surgical cut (incision) area every day. Make sure there are no signs of infection, such as:   Pain.   Swelling.   Redness.   Warmth.   An opening of the incision.   Bleeding or leaking fluid.   Keep the incision area dry. Do not use lotions or creams unless your caregiver tells you to.  Diet  You may not feel like eating for a while. This is normal and may last for a few weeks. When you are able to eat, try to eat:   Fruits and vegetables.   Foods with lean protein. Examples are boneless and skinless chicken breasts, lean beef, egg whites, and seafood like tuna and shrimp.   Low-fat dairy products.   Foods that are high in fiber. Examples are whole grains, beans, most fruits, and nuts.   Do not eat foods that contain a lot of fat. They can be hard to digest.   Drink enough fluids to keep your urine clear or pale yellow. This helps prevent constipation.   Try eating small portions more often than 3 times a day. Do not skip meals.   Weigh yourself once a week. Wear the same amount of  clothes each time you weigh yourself.  Activities  Do not lift anything heavy for 6 weeks after your surgery. Do not lift anything heavier than 10 pounds (4.5 kg) until your caregiver says it is okay.   Try to walk 100 yards (90 meters) every day. Do not push yourself too hard. After a few weeks, start to slowly increase how far you walk.   You can take showers after your bandages are off. Do not take tub baths or swim until your caregiver says it is okay.    Do not drive if you are taking narcotic pain medicines.   Ask your caregiver whether it is okay for you do to certain activities, such as going back to work, driving a car, or having sex. It may be a few months before you can go back to all your normal activities.  Follow up  Keep all your appointments. This is how your caregiver can tell if you are getting better.   Call your caregiver with any questions.  SEEK MEDICAL CARE IF:  Your appetite does not get better.   You have nausea that will not go away.   You have constipation.   Your pain does not go away, even after taking pain medicine.   You become very thirsty, overly tired, dizzy, or you need to urinate often.  SEEK IMMEDIATE MEDICAL CARE IF:  You cannot eat.   You are vomiting.   You have very bad pain that is getting worse.   You are very tired.   You have very bad constipation or diarrhea.   Your skin around the incision or drainage tubes becomes swollen, red, or leaks blood or other fluid.   Your incision or drainage area hurts.   You notice a bad smell or a change in the color of fluid in the drainage tube.   Your incision starts to open.   You have a fever.   You have chest pain or difficulty breathing.  MAKE SURE YOU:  Understand these instructions.   Will watch your condition.

## 2011-07-03 NOTE — Assessment & Plan Note (Signed)
Pt has family history of pancreatic cancer, and high CEA values on EUS, solid component of tumor.  Will plan distal pancreatectomy.  Mass is in body of pancreas.  May need to open to get good surgical margin.  Discussed this with pt.   Reviewed risks of surgery including risk of becoming diabetic or having exocrine insufficiency.   Discussed possible infection, bleeding, damage to adjacent structures, need for prolonged drain placement, possible need for an additional drain. Discussed post splenectomy sepsis.   Reviewed vaccinations.   Discussed post op recovery and restrictions.  Pt wishes to proceed.

## 2011-07-03 NOTE — Progress Notes (Signed)
Chief Complaint  Patient presents with  . Mass    Pancreatic    HISTORY: Pt presents with history of gallstones and cystic mass in pancreas discovered on workup for hematuria.  . She underwent EUS and cyst aspiration.  The CEA was high, and the cytology was atypical.  She presents to discuss surgery.  She does have occasional abdominal bloating and has been avoiding fatty foods since her dx of gallstones several years ago.  She had a mother with pancreatic cancer and wants to be aggressive treating this pancreatic mass.    Past Medical History  Diagnosis Date  . Hyperlipidemia   . Hypercholesterolemia   . Pharyngitis, acute   . Pancreatic lesion     found on MRI  . Rectal bleeding   . Hemorrhoids   . Blood in urine   . Contact lens/glasses fitting   . Menopause   . Hormone replacement therapy (postmenopausal)     discontinued ten years ago  . Pancreatic mass     states mother died from pancreatic cancer  . PONV (postoperative nausea and vomiting)   . Hypertension     Past Surgical History  Procedure Date  . Ovarian cyst removal 2002 - approximate  . Tonsillectomy 1952  . Colonoscopy   . Cesarean section 1977, 1979    x2  . Eus 06/15/2011    Procedure: UPPER ENDOSCOPIC ULTRASOUND (EUS) LINEAR;  Surgeon: Rob Bunting, MD;  Location: [facility];  Service: Endoscopy;  Laterality: N/A;  radial linear    Current Outpatient Prescriptions  Medication Sig Dispense Refill  . Calcium Citrate-Vitamin D (CALCIUM CITRATE + D PO) Take 1 capsule by mouth as needed. Vitamin d 500 iu; calcium 630 mg      . hydrochlorothiazide (HYDRODIURIL) 25 MG tablet Take 25 mg by mouth daily.       Marland Kitchen lisinopril (PRINIVIL,ZESTRIL) 40 MG tablet Take 40 mg by mouth daily.       . Multiple Vitamins-Minerals (CENTURY SENIOR PO) Take 1 capsule by mouth daily.       . simvastatin (ZOCOR) 40 MG tablet Take 40 mg by mouth at bedtime.          Allergies  Allergen Reactions  . Codeine Nausea And Vomiting    . Sulfa Antibiotics Hives  . Ciprofloxacin     Rash   . Penicillins Other (See Comments)    Told since she was a child not to take.     Family History  Problem Relation Age of Onset  . Cancer Mother     pancreatic  . Other Father     respiration problems, bleeding ulcers, cardiac  . Anesthesia problems Neg Hx   . Hypotension Neg Hx   . Malignant hyperthermia Neg Hx   . Pseudochol deficiency Neg Hx      History   Social History  . Marital Status: Widowed    Spouse Name: N/A    Number of Children: N/A  . Years of Education: N/A   Social History Main Topics  . Smoking status: Never Smoker   . Smokeless tobacco: Never Used  . Alcohol Use: Yes     rarely  . Drug Use: No  . Sexually Active: None    REVIEW OF SYSTEMS - PERTINENT POSITIVES ONLY: 12 point review of systems negative other than HPI and PMH  EXAM: Filed Vitals:   07/03/11 1023  BP: 128/76  Pulse: 68  Temp: 98.1 F (36.7 C)  Resp: 16    Gen:  No acute distress.  Well nourished and well groomed.   Neurological: Alert and oriented to person, place, and time. Coordination normal.  Head: Normocephalic and atraumatic.  Eyes: Conjunctivae are normal. Pupils are equal, round, and reactive to light. No scleral icterus.   Cardiovascular: Normal rate, regular rhythm. Respiratory: Effort normal.  No respiratory distress. No chest wall tenderness.  Musculoskeletal: Normal range of motion.  Skin: Skin is warm and dry. No rash noted. No diaphoresis. No erythema. No pallor. No clubbing, cyanosis, or edema.   Psychiatric: Normal mood and affect. Behavior is normal. Judgment and thought content normal.    LABORATORY RESULTS: Available labs are reviewed, CA 19-9 normal   RADIOLOGY RESULTS: IMPRESSION:  2.7 cm nonenhancing cystic lesion in the pancreatic body, as  described above. The appearance favors a mucinous cystic neoplasm,  less likely a pseudocyst if there is documented prior history of   pancreatitis.  Pancreatic divisum.  Cholelithiasis, without associated findings to suggest acute  cholecystitis.     ASSESSMENT AND PLAN: 30 minutes spent in counseling regarding the operation . Cholelithiasis Plan lap chole in conjunction with above procedure.  The surgical procedure was described to the patient in detail.  The patient was given Agricultural engineer. .  I discussed the incision type and location, the location of the gallbladder, the anatomy of the bile ducts and arteries, and the typical progression of surgery.  I discussed the possibility of converting to an open operation.  I advised of the risks of bleeding, infection, damage to other structures (such as the bile duct, intestine or liver), bile leak, need for other procedures or surgeries, and post op diarrhea/constipation.  We discussed the risk of blood clot.  We discussed the recovery period and post operative restrictions.  The patient was advised against taking blood thinners the week before surgery.       Pancreatic mass Pt has family history of pancreatic cancer, and high CEA values on EUS, solid component of tumor.  Will plan distal pancreatectomy.  Mass is in body of pancreas.  May need to open to get good surgical margin.  Discussed this with pt.   Reviewed risks of surgery including risk of becoming diabetic or having exocrine insufficiency.   Discussed possible infection, bleeding, damage to adjacent structures, need for prolonged drain placement, possible need for an additional drain. Discussed post splenectomy sepsis.   Reviewed vaccinations.   Discussed post op recovery and restrictions.  Pt wishes to proceed.          Maudry Diego MD Surgical Oncology, General and Endocrine Surgery [facility]      Visit Diagnoses: 1. Cholelithiasis   2. Pancreatic mass     Primary Care Physician: Gretel Acre, MD, MD

## 2011-07-05 ENCOUNTER — Telehealth: Payer: Self-pay | Admitting: Gastroenterology

## 2011-07-05 NOTE — Telephone Encounter (Signed)
The pt would like Dr Christella Hartigan to call her to discuss the surgery that she has upcoming with Dr Donell Beers.  She says it is more to it than what she understood from the discussion with Dr Christella Hartigan.  She would like some clarification.  Please call

## 2011-07-05 NOTE — Telephone Encounter (Signed)
I spoke with her about upcoming pancreatic surgery.  Answered her questions to her satifaction.  She is planning to proceed.

## 2011-07-25 ENCOUNTER — Encounter (HOSPITAL_COMMUNITY): Payer: Self-pay | Admitting: Pharmacy Technician

## 2011-07-26 ENCOUNTER — Other Ambulatory Visit (HOSPITAL_COMMUNITY): Payer: Medicare Other

## 2011-07-28 ENCOUNTER — Telehealth (INDEPENDENT_AMBULATORY_CARE_PROVIDER_SITE_OTHER): Payer: Self-pay | Admitting: General Surgery

## 2011-07-28 NOTE — Telephone Encounter (Signed)
DARLENE CALLED TO REQUEST THAT SURGERY ORDERS BE PUT INTO EPIC FOR PT Valerie Lawson/ SURGERY IS SCHEDULED FOR 08-03-11/  dob [birth year].DARLENE'S PHONE # [phone]/ Cleophus Molt

## 2011-08-02 ENCOUNTER — Encounter (HOSPITAL_COMMUNITY)
Admission: RE | Admit: 2011-08-02 | Discharge: 2011-08-02 | Disposition: A | Discharge status: Home / Self Care | Payer: Medicare Other | Source: Ambulatory Visit | Attending: General Surgery | Admitting: General Surgery

## 2011-08-02 ENCOUNTER — Other Ambulatory Visit: Payer: Self-pay

## 2011-08-02 ENCOUNTER — Encounter (HOSPITAL_COMMUNITY): Payer: Self-pay

## 2011-08-02 ENCOUNTER — Ambulatory Visit (HOSPITAL_COMMUNITY)
Admission: RE | Admit: 2011-08-02 | Discharge: 2011-08-02 | Disposition: A | Discharge status: Home / Self Care | Payer: Medicare Other | Source: Ambulatory Visit | Attending: General Surgery | Admitting: General Surgery

## 2011-08-02 LAB — COMPREHENSIVE METABOLIC PANEL
ALT: 17 U/L (ref 0–35)
Alkaline Phosphatase: 56 U/L (ref 39–117)
CO2: 30 mEq/L (ref 19–32)
Calcium: 10.1 mg/dL (ref 8.4–10.5)
Chloride: 103 mEq/L (ref 96–112)
GFR calc Af Amer: >90 mL/min (ref >90)
GFR calc non Af Amer: 87 mL/min — ABNORMAL LOW (ref >90)
Glucose, Bld: 106 mg/dL — ABNORMAL HIGH (ref 70–99)
Sodium: 139 mEq/L (ref 135–145)
Total Bilirubin: 0.4 mg/dL (ref 0.3–1.2)

## 2011-08-02 LAB — CBC
HCT: 39.8 % (ref 36.0–46.0)
MCH: 31.2 pg (ref 26.0–34.0)
MCV: 90 fL (ref 78.0–100.0)
Platelets: 349 10*3/uL (ref 150–400)
RBC: 4.42 MIL/uL (ref 3.87–5.11)
RDW: 12.2 % (ref 11.5–15.5)

## 2011-08-02 NOTE — Pre-Procedure Instructions (Signed)
Spoke with Valerie Lawson at [facility], pt instructed clear liquids staarting now, pt will pick up bowel prep instructions at [facility]. Left message for dr Donell Beers to add cholecystectomy to or consent. Pt received menigococcal vaccine, haemophilus influenza vaccine and pneumovax on 07-10-2011

## 2011-08-02 NOTE — Patient Instructions (Addendum)
20 Valerie Lawson  08/02/2011   Your procedure is scheduled on: 08-03-11     Report to [facility] at 0530  AM.  Call this number if you have problems the morning of surgery: [phone]   Remember:   Clear liquids with 1 day bowel prep all day Wednesday, no food. No liquids after midnight ..  .  Take these medicines the morning of surgery with A SIP OF WATER: es tylenol as needed   Do not wear jewelry or make up.  Do not wear lotions, powders, or perfumes.Do not wear deodorant.    Do not bring valuables to the hospital.  Contacts, dentures or bridgework may not be worn into surgery.  Leave suitcase in the car. After surgery it may be brought to your room.  For patients admitted to the hospital, checkout time is 11:00 AM the day of discharge.     Special Instructions: CHG Shower Use Special Wash: 1/2 bottle night before surgery and 1/2 bottle morning of surgery.neck down avoid private area, no shaving.   Please read over the following fact sheets that you were given: MRSA Information, blood fact sheet   Cain Sieve [facility] pre op nurse phone number [phone], call if needed

## 2011-08-03 ENCOUNTER — Inpatient Hospital Stay (HOSPITAL_COMMUNITY)
Admission: RE | Admit: 2011-08-03 | Discharge: 2011-08-11 | DRG: 406 | Disposition: A | Discharge status: Home / Self Care | Payer: Medicare Other | Source: Ambulatory Visit | Attending: General Surgery | Admitting: General Surgery

## 2011-08-03 ENCOUNTER — Encounter (HOSPITAL_COMMUNITY): Payer: Self-pay | Admitting: *Deleted

## 2011-08-03 ENCOUNTER — Inpatient Hospital Stay (HOSPITAL_COMMUNITY): Payer: Medicare Other | Admitting: *Deleted

## 2011-08-03 ENCOUNTER — Encounter (HOSPITAL_COMMUNITY)
Admission: RE | Disposition: A | Discharge status: Home / Self Care | Payer: Self-pay | Source: Ambulatory Visit | Attending: General Surgery

## 2011-08-03 ENCOUNTER — Encounter (HOSPITAL_COMMUNITY): Payer: Self-pay | Admitting: General Surgery

## 2011-08-03 DIAGNOSIS — D62 Acute posthemorrhagic anemia: Secondary | ICD-10-CM | POA: Diagnosis not present

## 2011-08-03 DIAGNOSIS — I10 Essential (primary) hypertension: Secondary | ICD-10-CM | POA: Diagnosis present

## 2011-08-03 DIAGNOSIS — E876 Hypokalemia: Secondary | ICD-10-CM | POA: Diagnosis not present

## 2011-08-03 DIAGNOSIS — K8020 Calculus of gallbladder without cholecystitis without obstruction: Secondary | ICD-10-CM | POA: Diagnosis present

## 2011-08-03 DIAGNOSIS — K869 Disease of pancreas, unspecified: Principal | ICD-10-CM | POA: Diagnosis present

## 2011-08-03 DIAGNOSIS — R7309 Other abnormal glucose: Secondary | ICD-10-CM | POA: Diagnosis not present

## 2011-08-03 DIAGNOSIS — K8689 Other specified diseases of pancreas: Secondary | ICD-10-CM

## 2011-08-03 DIAGNOSIS — D136 Benign neoplasm of pancreas: Secondary | ICD-10-CM

## 2011-08-03 DIAGNOSIS — K8010 Calculus of gallbladder with chronic cholecystitis without obstruction: Secondary | ICD-10-CM

## 2011-08-03 DIAGNOSIS — K560 Paralytic ileus: Secondary | ICD-10-CM | POA: Diagnosis not present

## 2011-08-03 DIAGNOSIS — R0602 Shortness of breath: Secondary | ICD-10-CM | POA: Diagnosis not present

## 2011-08-03 DIAGNOSIS — D135 Benign neoplasm of extrahepatic bile ducts: Secondary | ICD-10-CM

## 2011-08-03 DIAGNOSIS — D134 Benign neoplasm of liver: Secondary | ICD-10-CM

## 2011-08-03 DIAGNOSIS — J90 Pleural effusion, not elsewhere classified: Secondary | ICD-10-CM | POA: Diagnosis not present

## 2011-08-03 HISTORY — PX: CHOLECYSTECTOMY: SHX55

## 2011-08-03 HISTORY — PX: PANCREATECTOMY: SHX5261

## 2011-08-03 HISTORY — PX: LAPAROSCOPIC SPLENECTOMY: SHX409

## 2011-08-03 LAB — CREATININE, SERUM
Creatinine, Ser: 0.73 mg/dL (ref 0.50–1.10)
GFR calc non Af Amer: 88 mL/min — ABNORMAL LOW (ref >90)

## 2011-08-03 LAB — CBC
Hemoglobin: 11.7 g/dL — ABNORMAL LOW (ref 12.0–15.0)
MCHC: 34 g/dL (ref 30.0–36.0)
WBC: 21.8 10*3/uL — ABNORMAL HIGH (ref 4.0–10.5)

## 2011-08-03 SURGERY — LAPAROSCOPIC CHOLECYSTECTOMY
Anesthesia: General | Site: Abdomen | Wound class: Clean Contaminated

## 2011-08-03 MED ORDER — OPTI-FREE EXPRESS REWETTING SOLN: 1.0000 [drp] | Freq: Two times a day (BID) | Status: DC | PRN

## 2011-08-03 MED ORDER — PROMETHAZINE HCL 25 MG/ML IJ SOLN: 6.2500 mg | INTRAMUSCULAR | Status: DC | PRN

## 2011-08-03 MED ORDER — FENTANYL CITRATE 0.05 MG/ML IJ SOLN
INTRAMUSCULAR | Status: DC | PRN
  Administered 2011-08-03: 50 ug via INTRAVENOUS
  Administered 2011-08-03 (×4): 100 ug via INTRAVENOUS
  Administered 2011-08-03: 50 ug via INTRAVENOUS

## 2011-08-03 MED ORDER — POLYVINYL ALCOHOL 1.4 % OP SOLN
1.0000 [drp] | Freq: Two times a day (BID) | OPHTHALMIC | Status: DC | PRN
  Filled 2011-08-03: qty 15

## 2011-08-03 MED ORDER — HYDROMORPHONE HCL PF 1 MG/ML IJ SOLN
INTRAMUSCULAR | Status: DC | PRN
  Administered 2011-08-03 (×2): 1 mg via INTRAVENOUS

## 2011-08-03 MED ORDER — EPHEDRINE SULFATE 50 MG/ML IJ SOLN
INTRAMUSCULAR | Status: DC | PRN
  Administered 2011-08-03: 10 mg via INTRAVENOUS
  Administered 2011-08-03 (×3): 5 mg via INTRAVENOUS

## 2011-08-03 MED ORDER — ONDANSETRON HCL 4 MG/2ML IJ SOLN
INTRAMUSCULAR | Status: DC | PRN
  Administered 2011-08-03: 4 mg via INTRAVENOUS

## 2011-08-03 MED ORDER — MIDAZOLAM HCL 5 MG/5ML IJ SOLN
INTRAMUSCULAR | Status: DC | PRN
  Administered 2011-08-03: 2 mg via INTRAVENOUS

## 2011-08-03 MED ORDER — DEXAMETHASONE SODIUM PHOSPHATE 10 MG/ML IJ SOLN
INTRAMUSCULAR | Status: DC | PRN
  Administered 2011-08-03: 10 mg via INTRAVENOUS

## 2011-08-03 MED ORDER — MORPHINE SULFATE 2 MG/ML IJ SOLN
1.0000 mg | INTRAMUSCULAR | Status: DC | PRN
  Administered 2011-08-05 – 2011-08-07 (×3): 2 mg via INTRAVENOUS
  Filled 2011-08-03 (×3): qty 1

## 2011-08-03 MED ORDER — ONDANSETRON HCL 4 MG/2ML IJ SOLN
4.0000 mg | Freq: Four times a day (QID) | INTRAMUSCULAR | Status: DC | PRN
  Administered 2011-08-03 – 2011-08-04 (×2): 4 mg via INTRAVENOUS
  Filled 2011-08-03 (×2): qty 2

## 2011-08-03 MED ORDER — SODIUM CHLORIDE 0.9 % IV BOLUS (SEPSIS)
500.0000 mL | Freq: Once | INTRAVENOUS | Status: AC
  Administered 2011-08-03: 500 mL via INTRAVENOUS

## 2011-08-03 MED ORDER — 0.9 % SODIUM CHLORIDE (POUR BTL) OPTIME
TOPICAL | Status: DC | PRN
  Administered 2011-08-03: 1000 mL

## 2011-08-03 MED ORDER — SIMVASTATIN 40 MG PO TABS
40.0000 mg | ORAL_TABLET | Freq: Every day | ORAL | Status: DC
  Administered 2011-08-05 – 2011-08-10 (×6): 40 mg via ORAL
  Filled 2011-08-03 (×7): qty 1

## 2011-08-03 MED ORDER — BUPIVACAINE-EPINEPHRINE PF 0.25-1:200000 % IJ SOLN
INTRAMUSCULAR | Status: DC | PRN
  Administered 2011-08-03: 20 mL

## 2011-08-03 MED ORDER — HYDROCHLOROTHIAZIDE 25 MG PO TABS
25.0000 mg | ORAL_TABLET | Freq: Every day | ORAL | Status: DC
  Administered 2011-08-05 – 2011-08-11 (×7): 25 mg via ORAL
  Filled 2011-08-03 (×7): qty 1

## 2011-08-03 MED ORDER — PROPOFOL 10 MG/ML IV EMUL
INTRAVENOUS | Status: DC | PRN
  Administered 2011-08-03: 150 mg via INTRAVENOUS

## 2011-08-03 MED ORDER — LACTATED RINGERS IV SOLN
INTRAVENOUS | Status: DC | PRN
  Administered 2011-08-03 (×4): via INTRAVENOUS

## 2011-08-03 MED ORDER — HYDROCORTISONE 2.5 % RE CREA
1.0000 "application " | TOPICAL_CREAM | Freq: Two times a day (BID) | RECTAL | Status: DC
  Administered 2011-08-03 – 2011-08-06 (×4): 1 via RECTAL
  Filled 2011-08-03 (×2): qty 28.35

## 2011-08-03 MED ORDER — DROPERIDOL 2.5 MG/ML IJ SOLN
INTRAMUSCULAR | Status: DC | PRN
  Administered 2011-08-03: 0.625 mg via INTRAVENOUS

## 2011-08-03 MED ORDER — KETOROLAC TROMETHAMINE 15 MG/ML IJ SOLN: 15.0000 mg | Freq: Four times a day (QID) | INTRAMUSCULAR | Status: DC | PRN

## 2011-08-03 MED ORDER — ENOXAPARIN SODIUM 40 MG/0.4ML ~~LOC~~ SOLN
40.0000 mg | SUBCUTANEOUS | Status: DC
  Administered 2011-08-03 – 2011-08-10 (×8): 40 mg via SUBCUTANEOUS
  Filled 2011-08-03 (×9): qty 0.4

## 2011-08-03 MED ORDER — KETOROLAC TROMETHAMINE 15 MG/ML IJ SOLN
15.0000 mg | Freq: Four times a day (QID) | INTRAMUSCULAR | Status: DC
  Administered 2011-08-03 – 2011-08-04 (×5): 15 mg via INTRAVENOUS
  Filled 2011-08-03 (×9): qty 1

## 2011-08-03 MED ORDER — LIDOCAINE HCL (PF) 1 % IJ SOLN
INTRAMUSCULAR | Status: DC | PRN
  Administered 2011-08-03: 20 mL

## 2011-08-03 MED ORDER — FENTANYL CITRATE 0.05 MG/ML IJ SOLN: 25.0000 ug | INTRAMUSCULAR | Status: DC | PRN

## 2011-08-03 MED ORDER — LACTATED RINGERS IV SOLN
INTRAVENOUS | Status: DC | PRN
  Administered 2011-08-03 (×2): via INTRAVENOUS

## 2011-08-03 MED ORDER — SODIUM CHLORIDE 0.9 % IV SOLN
INTRAVENOUS | Status: DC
  Administered 2011-08-03: 1000 mL via INTRAVENOUS
  Administered 2011-08-04: 125 mL/h via INTRAVENOUS
  Administered 2011-08-04: 1000 mL via INTRAVENOUS
  Administered 2011-08-05: 03:00:00 via INTRAVENOUS
  Administered 2011-08-06: 125 mL/h via INTRAVENOUS
  Administered 2011-08-06: 03:00:00 via INTRAVENOUS
  Administered 2011-08-06: 125 mL/h via INTRAVENOUS
  Administered 2011-08-07: 03:00:00 via INTRAVENOUS

## 2011-08-03 MED ORDER — SCOPOLAMINE 1 MG/3DAYS TD PT72
MEDICATED_PATCH | TRANSDERMAL | Status: DC | PRN
  Administered 2011-08-03: 1 via TRANSDERMAL

## 2011-08-03 MED ORDER — IOHEXOL 300 MG/ML  SOLN
INTRAMUSCULAR | Status: AC
  Filled 2011-08-03: qty 1

## 2011-08-03 MED ORDER — LIDOCAINE HCL 1 % IJ SOLN
INTRAMUSCULAR | Status: AC
  Filled 2011-08-03: qty 20

## 2011-08-03 MED ORDER — ONDANSETRON HCL 4 MG PO TABS
4.0000 mg | ORAL_TABLET | Freq: Four times a day (QID) | ORAL | Status: DC | PRN
  Administered 2011-08-08 – 2011-08-09 (×3): 4 mg via ORAL
  Filled 2011-08-03 (×4): qty 1

## 2011-08-03 MED ORDER — FIBRIN SEALANT COMPONENT 5 ML EX KIT
PACK | CUTANEOUS | Status: AC
  Filled 2011-08-03: qty 2

## 2011-08-03 MED ORDER — LACTATED RINGERS IR SOLN
Status: DC | PRN
  Administered 2011-08-03: 4000 mL

## 2011-08-03 MED ORDER — GLYCOPYRROLATE 0.2 MG/ML IJ SOLN
INTRAMUSCULAR | Status: DC | PRN
  Administered 2011-08-03: .8 mg via INTRAVENOUS

## 2011-08-03 MED ORDER — SODIUM CHLORIDE 0.9 % IV SOLN
1.0000 g | INTRAVENOUS | Status: AC
  Administered 2011-08-03: 1 g via INTRAVENOUS

## 2011-08-03 MED ORDER — PANTOPRAZOLE SODIUM 40 MG IV SOLR
40.0000 mg | INTRAVENOUS | Status: DC
  Administered 2011-08-03 – 2011-08-06 (×4): 40 mg via INTRAVENOUS
  Filled 2011-08-03 (×5): qty 40

## 2011-08-03 MED ORDER — SCOPOLAMINE 1 MG/3DAYS TD PT72
MEDICATED_PATCH | TRANSDERMAL | Status: AC
  Filled 2011-08-03: qty 1

## 2011-08-03 MED ORDER — LACTATED RINGERS IV SOLN: INTRAVENOUS | Status: DC

## 2011-08-03 MED ORDER — METOCLOPRAMIDE HCL 5 MG/ML IJ SOLN
INTRAMUSCULAR | Status: DC | PRN
  Administered 2011-08-03: 10 mg via INTRAVENOUS

## 2011-08-03 MED ORDER — BUPIVACAINE-EPINEPHRINE PF 0.25-1:200000 % IJ SOLN
INTRAMUSCULAR | Status: AC
  Filled 2011-08-03: qty 30

## 2011-08-03 MED ORDER — ROCURONIUM BROMIDE 100 MG/10ML IV SOLN
INTRAVENOUS | Status: DC | PRN
  Administered 2011-08-03: 10 mg via INTRAVENOUS
  Administered 2011-08-03: 60 mg via INTRAVENOUS
  Administered 2011-08-03: 10 mg via INTRAVENOUS
  Administered 2011-08-03: 5 mg via INTRAVENOUS
  Administered 2011-08-03: 10 mg via INTRAVENOUS

## 2011-08-03 MED ORDER — NEOSTIGMINE METHYLSULFATE 1 MG/ML IJ SOLN
INTRAMUSCULAR | Status: DC | PRN
  Administered 2011-08-03: 5 mg via INTRAVENOUS

## 2011-08-03 MED ORDER — TISSEEL VH 10 ML EX KIT
PACK | CUTANEOUS | Status: DC | PRN
  Administered 2011-08-03: 10 mL

## 2011-08-03 MED ORDER — SODIUM CHLORIDE 0.9 % IV SOLN
INTRAVENOUS | Status: AC
  Filled 2011-08-03: qty 1

## 2011-08-03 MED ORDER — ACETAMINOPHEN 10 MG/ML IV SOLN
1000.0000 mg | Freq: Four times a day (QID) | INTRAVENOUS | Status: AC
  Administered 2011-08-03 – 2011-08-04 (×4): 1000 mg via INTRAVENOUS
  Filled 2011-08-03 (×6): qty 100

## 2011-08-03 MED ORDER — LIDOCAINE HCL (CARDIAC) 20 MG/ML IV SOLN
INTRAVENOUS | Status: DC | PRN
  Administered 2011-08-03: 100 mg via INTRAVENOUS

## 2011-08-03 MED ORDER — KCL IN DEXTROSE-NACL 20-5-0.45 MEQ/L-%-% IV SOLN
INTRAVENOUS | Status: DC
  Administered 2011-08-03: 1000 mL via INTRAVENOUS
  Filled 2011-08-03 (×3): qty 1000

## 2011-08-03 MED ORDER — HETASTARCH-ELECTROLYTES 6 % IV SOLN
INTRAVENOUS | Status: DC | PRN
  Administered 2011-08-03: 08:00:00 via INTRAVENOUS

## 2011-08-03 SURGICAL SUPPLY — 158 items
APPLIER CLIP 5 13 M/L LIGAMAX5 (MISCELLANEOUS) ×6
APPLIER CLIP ROT 10 11.4 M/L (STAPLE)
BENZOIN TINCTURE PRP APPL 2/3 (GAUZE/BANDAGES/DRESSINGS) IMPLANT
BLADE EXTENDED COATED 6.5IN (ELECTRODE) IMPLANT
BLADE HEX COATED 2.75 (ELECTRODE) IMPLANT
BLADE SURG 15 STRL LF DISP TIS (BLADE) IMPLANT
BLADE SURG 15 STRL SS (BLADE)
BLADE SURG SZ10 CARB STEEL (BLADE) ×3 IMPLANT
BLADE SURG SZ11 CARB STEEL (BLADE) IMPLANT
CANISTER SUCTION 2500CC (MISCELLANEOUS) ×3 IMPLANT
CATH FOLEY 2WAY SLVR  5CC 16FR (CATHETERS)
CATH FOLEY 2WAY SLVR 5CC 16FR (CATHETERS) IMPLANT
CATH ROBINSON RED A/P 12FR (CATHETERS) IMPLANT
CATH ROBINSON RED A/P 14FR (CATHETERS) IMPLANT
CATH ROBINSON RED A/P 16FR (CATHETERS) IMPLANT
CATH ROBINSON RED A/P 18FR (CATHETERS) IMPLANT
CATH ROBINSON RED A/P 20FR (CATHETERS) IMPLANT
CHLORAPREP W/TINT 26ML (MISCELLANEOUS) ×6 IMPLANT
CLIP APPLIE 5 13 M/L LIGAMAX5 (MISCELLANEOUS) ×4 IMPLANT
CLIP APPLIE ROT 10 11.4 M/L (STAPLE) IMPLANT
CLIP LIGATING HEM O LOK PURPLE (MISCELLANEOUS) IMPLANT
CLIP LIGATING HEMO O LOK GREEN (MISCELLANEOUS) IMPLANT
CLIP TI LARGE 6 (CLIP) IMPLANT
CLOTH BEACON ORANGE TIMEOUT ST (SAFETY) ×6 IMPLANT
CONT SPECI 4OZ STER CLIK (MISCELLANEOUS) IMPLANT
COVER MAYO STAND STRL (DRAPES) ×3 IMPLANT
CUTTER FLEX LINEAR 45M (STAPLE) IMPLANT
DECANTER SPIKE VIAL GLASS SM (MISCELLANEOUS) ×3 IMPLANT
DERMABOND ADVANCED (GAUZE/BANDAGES/DRESSINGS) ×2
DERMABOND ADVANCED .7 DNX12 (GAUZE/BANDAGES/DRESSINGS) ×4 IMPLANT
DISSECTOR ROUND CHERRY 3/8 STR (MISCELLANEOUS) IMPLANT
DRAIN CHANNEL 19F RND (DRAIN) ×3 IMPLANT
DRAIN CHANNEL RND F F (WOUND CARE) IMPLANT
DRAPE C-ARM 42X72 X-RAY (DRAPES) IMPLANT
DRAPE CAMERA CLOSED 9X96 (DRAPES) ×3 IMPLANT
DRAPE LAPAROSCOPIC ABDOMINAL (DRAPES) ×6 IMPLANT
DRAPE LG THREE QUARTER DISP (DRAPES) IMPLANT
DRAPE TABLE BACK 44X90 PK DISP (DRAPES) IMPLANT
DRAPE UTILITY 15X26 (DRAPE) ×6 IMPLANT
DRAPE WARM FLUID 44X44 (DRAPE) ×6 IMPLANT
DRESSING TELFA 8X3 (GAUZE/BANDAGES/DRESSINGS) ×3 IMPLANT
DRESSING TELFA ISLAND 4X8 (GAUZE/BANDAGES/DRESSINGS) IMPLANT
DRSG PAD ABDOMINAL 8X10 ST (GAUZE/BANDAGES/DRESSINGS) IMPLANT
DRSG TEGADERM 4X4.75 (GAUZE/BANDAGES/DRESSINGS) ×6 IMPLANT
DRSG TELFA 4X10 ISLAND STR (GAUZE/BANDAGES/DRESSINGS) IMPLANT
DRSG TELFA PLUS 4X6 ADH ISLAND (GAUZE/BANDAGES/DRESSINGS) IMPLANT
ELECT BLADE 6.5 EXT (BLADE) IMPLANT
ELECT CAUTERY BLADE 6.4 (BLADE) ×3 IMPLANT
ELECT REM PT RETURN 9FT ADLT (ELECTROSURGICAL) ×6
ELECTRODE REM PT RTRN 9FT ADLT (ELECTROSURGICAL) ×4 IMPLANT
ENDOLOOP SUT PDS II  0 18 (SUTURE)
ENDOLOOP SUT PDS II 0 18 (SUTURE) IMPLANT
EVACUATOR DRAINAGE 10X20 100CC (DRAIN) ×2 IMPLANT
EVACUATOR SILICONE 100CC (DRAIN) ×1 IMPLANT
GAUZE SPONGE 4X4 16PLY XRAY LF (GAUZE/BANDAGES/DRESSINGS) IMPLANT
GLOVE BIO SURGEON STRL SZ 6 (GLOVE) ×9 IMPLANT
GLOVE BIO SURGEON STRL SZ7 (GLOVE) IMPLANT
GLOVE BIOGEL PI IND STRL 6.5 (GLOVE) IMPLANT
GLOVE BIOGEL PI IND STRL 7.0 (GLOVE) IMPLANT
GLOVE BIOGEL PI IND STRL 7.5 (GLOVE) ×8 IMPLANT
GLOVE BIOGEL PI INDICATOR 6.5 (GLOVE)
GLOVE BIOGEL PI INDICATOR 7.0 (GLOVE)
GLOVE BIOGEL PI INDICATOR 7.5 (GLOVE) ×4
GLOVE INDICATOR 6.5 STRL GRN (GLOVE) ×6 IMPLANT
GOWN PREVENTION PLUS LG XLONG (DISPOSABLE) IMPLANT
GOWN PREVENTION PLUS XLARGE (GOWN DISPOSABLE) ×3 IMPLANT
GOWN PREVENTION PLUS XXLARGE (GOWN DISPOSABLE) ×6 IMPLANT
GOWN STRL NON-REIN LRG LVL3 (GOWN DISPOSABLE) ×12 IMPLANT
GOWN STRL REIN XL XLG (GOWN DISPOSABLE) ×6 IMPLANT
HEMOSTAT SURGICEL 2X14 (HEMOSTASIS) IMPLANT
HEMOSTAT SURGICEL 4X8 (HEMOSTASIS) ×3 IMPLANT
IV LACTATED RINGER IRRG 3000ML (IV SOLUTION) ×1
IV LACTATED RINGERS 1000ML (IV SOLUTION) ×3 IMPLANT
IV LR IRRIG 3000ML ARTHROMATIC (IV SOLUTION) ×2 IMPLANT
KIT BASIN OR (CUSTOM PROCEDURE TRAY) ×3 IMPLANT
LOOP MINI RED (MISCELLANEOUS) IMPLANT
LOOP VESSEL MAXI BLUE (MISCELLANEOUS) IMPLANT
LUBRICANT JELLY K Y 4OZ (MISCELLANEOUS) IMPLANT
NEEDLE BIOPSY 14GX4.5 SOFT TIS (NEEDLE) IMPLANT
NEEDLE BIOPSY 14X6 SOFT TISS (NEEDLE) IMPLANT
NS IRRIG 1000ML POUR BTL (IV SOLUTION) ×6 IMPLANT
PACK ABDOMINAL WL (CUSTOM PROCEDURE TRAY) IMPLANT
PACK UNIVERSAL I (CUSTOM PROCEDURE TRAY) IMPLANT
PENCIL BUTTON HOLSTER BLD 10FT (ELECTRODE) ×3 IMPLANT
PLUG CATH AND CAP STER (CATHETERS) IMPLANT
POUCH SPECIMEN RETRIEVAL 10MM (ENDOMECHANICALS) ×3 IMPLANT
RELOAD 45 VASCULAR/THIN (ENDOMECHANICALS) IMPLANT
RELOAD BLUE (STAPLE) ×6 IMPLANT
RELOAD GOLD (STAPLE) IMPLANT
RELOAD STAPLE TA45 3.5 REG BLU (ENDOMECHANICALS) IMPLANT
RELOAD WHITE ECR60W (STAPLE) ×3 IMPLANT
SCALPEL HARMONIC ACE (MISCELLANEOUS) ×3 IMPLANT
SCISSORS LAP 5X45 EPIX DISP (ENDOMECHANICALS) ×3 IMPLANT
SET CHOLANGIOGRAPH MIX (MISCELLANEOUS) IMPLANT
SET IRRIG TUBING LAPAROSCOPIC (IRRIGATION / IRRIGATOR) ×6 IMPLANT
SET TUBE IRRIG SUCTION NO TIP (IRRIGATION / IRRIGATOR) IMPLANT
SHEARS FOC LG CVD HARMONIC 17C (MISCELLANEOUS) IMPLANT
SLEEVE SURGEON STRL (DRAPES) ×3 IMPLANT
SLEEVE XCEL OPT CAN 5 100 (ENDOMECHANICALS) IMPLANT
SOLUTION ANTI FOG 6CC (MISCELLANEOUS) ×3 IMPLANT
SPONGE DRAIN TRACH 4X4 STRL 2S (GAUZE/BANDAGES/DRESSINGS) ×3 IMPLANT
SPONGE GAUZE 4X4 12PLY (GAUZE/BANDAGES/DRESSINGS) IMPLANT
SPONGE LAP 18X18 X RAY DECT (DISPOSABLE) ×3 IMPLANT
SPONGE SURGIFOAM ABS GEL 100 (HEMOSTASIS) IMPLANT
STAPLE ECHEON FLEX 60 POW ENDO (STAPLE) ×3 IMPLANT
STAPLER PROXIMATE 75MM BLUE (STAPLE) IMPLANT
STAPLER VISISTAT 35W (STAPLE) IMPLANT
STRIP CLOSURE SKIN 1/2X4 (GAUZE/BANDAGES/DRESSINGS) IMPLANT
STRIP PERI DRY ECHOLON STPL (ENDOMECHANICALS) ×6 IMPLANT
SUCTION POOLE TIP (SUCTIONS) IMPLANT
SUT CHROMIC 3 0 SH 27 (SUTURE) IMPLANT
SUT CHROMIC 4 0 RB 1X27 (SUTURE) IMPLANT
SUT ETHIBOND 2-0 60INL (SUTURE) IMPLANT
SUT ETHILON 2 0 PS N (SUTURE) ×3 IMPLANT
SUT MNCRL AB 4-0 PS2 18 (SUTURE) ×9 IMPLANT
SUT PDS AB 0 CTX 60 (SUTURE) ×6 IMPLANT
SUT PDS AB 1 TP1 54 (SUTURE) IMPLANT
SUT PROLENE 3 0 SH 48 (SUTURE) IMPLANT
SUT PROLENE 3 0 SH1 36 (SUTURE) IMPLANT
SUT PROLENE 4 0 RB 1 (SUTURE)
SUT PROLENE 4-0 RB1 .5 CRCL 36 (SUTURE) IMPLANT
SUT PROLENE 5 0 CC 1 (SUTURE) IMPLANT
SUT SILK 0 FSL (SUTURE) ×3 IMPLANT
SUT SILK 2 0 (SUTURE) ×1
SUT SILK 2 0 SH CR/8 (SUTURE) ×3 IMPLANT
SUT SILK 2-0 18XBRD TIE 12 (SUTURE) ×2 IMPLANT
SUT SILK 3 0 (SUTURE)
SUT SILK 3 0 SH CR/8 (SUTURE) IMPLANT
SUT SILK 3-0 18XBRD TIE 12 (SUTURE) IMPLANT
SUT VIC AB 3-0 SH 18 (SUTURE) IMPLANT
SUT VIC AB 4-0 SH 18 (SUTURE) IMPLANT
SUT VICRYL 0 ENDOLOOP (SUTURE) IMPLANT
SUT VICRYL 2 0 18  UND BR (SUTURE) ×1
SUT VICRYL 2 0 18 UND BR (SUTURE) ×2 IMPLANT
SUT VICRYL 3 0 BR 18  UND (SUTURE) ×1
SUT VICRYL 3 0 BR 18 UND (SUTURE) ×2 IMPLANT
SYR BULB IRRIGATION 50ML (SYRINGE) IMPLANT
SYRINGE 10CC LL (SYRINGE) IMPLANT
SYS LAPSCP GELPORT 120MM (MISCELLANEOUS) ×3
SYSTEM LAPSCP GELPORT 120MM (MISCELLANEOUS) ×2 IMPLANT
TAPE UMBILICAL COTTON 1/8X30 (MISCELLANEOUS) IMPLANT
TOWEL BLUE STERILE X RAY DET (MISCELLANEOUS) ×3 IMPLANT
TOWEL OR 17X26 10 PK STRL BLUE (TOWEL DISPOSABLE) ×6 IMPLANT
TRAY FOLEY CATH 14FRSI W/METER (CATHETERS) ×3 IMPLANT
TRAY FOLEY METER SIL LF 16FR (CATHETERS) IMPLANT
TRAY LAP CHOLE (CUSTOM PROCEDURE TRAY) ×3 IMPLANT
TROCAR BLADELESS OPT 5 100 (ENDOMECHANICALS) IMPLANT
TROCAR BLADELESS OPT 5 75 (ENDOMECHANICALS) ×9 IMPLANT
TROCAR ENDOPATH XCEL 12X100 BL (ENDOMECHANICALS) IMPLANT
TROCAR XCEL 12X100 BLDLESS (ENDOMECHANICALS) IMPLANT
TROCAR XCEL BLUNT TIP 100MML (ENDOMECHANICALS) ×3 IMPLANT
TROCAR XCEL NON-BLD 11X100MML (ENDOMECHANICALS) ×3 IMPLANT
TUBE FEEDING 5FR 36IN KANGAROO (TUBING) IMPLANT
TUBING CONNECTING 10 (TUBING) ×3 IMPLANT
TUBING FILTER THERMOFLATOR (ELECTROSURGICAL) ×3 IMPLANT
TUBING INSUFFLATION 10FT LAP (TUBING) ×3 IMPLANT
YANKAUER SUCT BULB TIP 10FT TU (MISCELLANEOUS) IMPLANT
YANKAUER SUCT BULB TIP NO VENT (SUCTIONS) IMPLANT

## 2011-08-03 NOTE — Progress Notes (Signed)
Aline Lt wrist removed intact. Pressure held x 5 minutes without bleeding or hematoma formation. Pressure dressing applied. CMS intact to left hand.

## 2011-08-03 NOTE — Preoperative (Signed)
Beta Blockers   Reason not to administer Beta Blockers:Not Applicable 

## 2011-08-03 NOTE — Progress Notes (Signed)
Pt complained of nausea, administered Zofran 4mg . At 21:20 nausea was resolved.

## 2011-08-03 NOTE — Anesthesia Preprocedure Evaluation (Signed)
Anesthesia Evaluation  Patient identified by MRN, date of birth, ID band Patient awake    Reviewed: Allergy & Precautions, H&P , NPO status , Patient's Chart, lab work & pertinent test results  History of Anesthesia Complications (+) PONV  Airway Mallampati: II TM Distance: >3 FB Neck ROM: Full    Dental  (+) Teeth Intact and Dental Advisory Given   Pulmonary neg pulmonary ROS,  clear to auscultation  Pulmonary exam normal       Cardiovascular hypertension, Pt. on medications Regular Normal    Neuro/Psych Negative Neurological ROS  Negative Psych ROS   GI/Hepatic negative GI ROS, Neg liver ROS,   Endo/Other  Pancreatic mass  Renal/GU negative Renal ROS  Genitourinary negative   Musculoskeletal negative musculoskeletal ROS (+)   Abdominal   Peds  Hematology negative hematology ROS (+)   Anesthesia Other Findings   Reproductive/Obstetrics negative OB ROS                           Anesthesia Physical Anesthesia Plan  ASA: III  Anesthesia Plan: General   Post-op Pain Management:    Induction: Intravenous  Airway Management Planned: Oral ETT  Additional Equipment: Arterial line  Intra-op Plan:   Post-operative Plan: Extubation in OR  Informed Consent: I have reviewed the patients History and Physical, chart, labs and discussed the procedure including the risks, benefits and alternatives for the proposed anesthesia with the patient or authorized representative who has indicated his/her understanding and acceptance.   Dental advisory given  Plan Discussed with: CRNA  Anesthesia Plan Comments:         Anesthesia Quick Evaluation

## 2011-08-03 NOTE — Progress Notes (Signed)
Pt urinary output only 35 ml since arrival to unit at 14:38, despite prompting and checking foley for kinks or leaks. Received verbal order from Dr. Dwain Sarna to start 0.9% NS @ 125 mL/hr and 500 mL bolus.

## 2011-08-03 NOTE — Transfer of Care (Signed)
Immediate Anesthesia Transfer of Care Note  Patient: Valerie Lawson  Procedure(s) Performed: Procedure(s) (LRB): LAPAROSCOPIC CHOLECYSTECTOMY (N/A) LAPAROSCOPIC PANCREATECTOMY (N/A) LAPAROSCOPIC SPLENECTOMY (N/A)  Patient Location: PACU  Anesthesia Type: General  Level of Consciousness: awake and sedated  Airway & Oxygen Therapy: Patient Spontanous Breathing and Patient connected to face mask oxygen  Post-op Assessment: Report given to PACU RN and Post -op Vital signs reviewed and stable  Post vital signs: Reviewed and stable  Complications: No apparent anesthesia complications

## 2011-08-03 NOTE — Anesthesia Postprocedure Evaluation (Signed)
Anesthesia Post Note  Patient: Valerie Lawson  Procedure(s) Performed: Procedure(s) (LRB): LAPAROSCOPIC CHOLECYSTECTOMY (N/A) LAPAROSCOPIC PANCREATECTOMY (N/A) LAPAROSCOPIC SPLENECTOMY (N/A)  Anesthesia type: General  Patient location: PACU  Post pain: Pain level controlled  Post assessment: Post-op Vital signs reviewed  Last Vitals:  Filed Vitals:   08/03/11 1300  BP:   Pulse: 76  Temp:   Resp: 6    Post vital signs: Reviewed  Level of consciousness: sedated  Complications: No apparent anesthesia complications

## 2011-08-03 NOTE — Progress Notes (Signed)
Oral airway out

## 2011-08-03 NOTE — Op Note (Signed)
PREOPERATIVE DIAGNOSIS:  Cholelithiasis and cystic pancreatic mass      POSTOPERATIVE DIAGNOSIS:  Same      PROCEDURE:  Laparoscopic cholecystectomy, laparoscopic hand-assisted distal   pancreatectomy and splenectomy.      SURGEON:  Almond Lint, MD      ASSISTANT:  Harriette Bouillon, M.D.      ANESTHESIA:  General and local.      FINDINGS:  Mild chronic inflammation of the gallbladder.  Cystic mass in body of pancreas     SPECIMEN:  Gallbladder, liver biopsy and distal pancreas and spleen to Pathology.      ESTIMATED BLOOD LOSS:  50 mL      COMPLICATIONS:  None known.      PROCEDURE:  Patient was identified in the holding area and taken to   operating room where she was placed supine on the operating room table.   Prior to the induction of general anesthesia, antibiotic prophylaxis was administered. General endotracheal anesthesia was then administered and tolerated well.  Foley catheter was placed.   After the induction, the abdomen was prepped with Chloraprep and draped in the sterile fashion. The patient was positioned in the supine position.  Local anesthetic agent was injected into the skin near the umbilicus and an incision made. We dissected down to the abdominal fascia with blunt dissection.  The fascia was incised vertically and we entered the peritoneal cavity bluntly.  A pursestring suture of 0-Vicryl was placed around the fascial opening.  The Hasson cannula was inserted and secured with the stay suture.  Pneumoperitoneum was then created with CO2 and tolerated well without any adverse changes in the patient's vital signs. An 11-mm port was placed in the subxiphoid position.  Two 5-mm ports were placed in the right upper quadrant. All skin incisions were infiltrated with a local anesthetic agent before making the incision and placing the trocars.   We positioned the patient in reverse Trendelenburg, tilted slightly to the patient's left.  The gallbladder was identified, the fundus  grasped and retracted cephalad. Adhesions were lysed bluntly and with the electrocautery where indicated, taking care not to injure any adjacent organs or viscus. The infundibulum was grasped and retracted laterally, exposing the peritoneum overlying the triangle of Calot. This was then divided and exposed in a blunt fashion. A small overlying vein was clipped and divided.  The artery was overlying the duct, so it was clipped and divided.    A critical view of the cystic duct and cystic artery was obtained.  The cystic duct was clearly identified and bluntly dissected circumferentially. The cystic duct was ligated with a clip distally, and 3 clips proximally.    The cystic duct was then divided.   The gallbladder was dissected from the liver bed in retrograde fashion with the electrocautery. The gallbladder was removed and placed in an Endocatch bag.  The gallbladder and Endocatch bag were then removed through the umbilical port site.  The liver bed was irrigated and inspected. Hemostasis was achieved with the electrocautery. Copious irrigation was utilized and was repeatedly aspirated until clear.    We again inspected the right upper quadrant for hemostasis.  Pneumoperitoneum was released as we removed the trocars.    4-0 Monocryl was used to close the skin of the right upper quadrant incisions.  The midline incisions were covered with tegaderm so they could be reused.  The skin was cleaned and dry, and Dermabond was applied.  Instrument, sponge, and needle counts were correct at closure  and at the conclusion of the case.    She was then  placed in the leaning spleen position.  Her abdomen was prepped and   draped in sterile fashion.  Time-out was performed according to surgical   safety check list.  When all was correct, we continued.    The tails of   the suture were used to hold the Hasson trocar and placed in the   abdominal cavity.  Pneumoperitoneum was achieved.       The 11 mm trocar was  replaced in the midline and then 3 were placed in the left upper quadrant as well.  These were  all 5 mm trocars.  The patient had some adhesions in the abdominal wall.  They were taken down with the Harmonic.  The lienocolic ligament was  taken down with the Harmonic.  The lesser sac was opened with the   Harmonic and all the adhesions were taken down.  Once the   stomach was completely off the spleen, the inferior border of the   pancreas was identified and this was opened up with the Harmonic.  The   hand port was placed in the upper midline.  The pancreas and the splenic   artery and vein were elevated.     The pancreas was then isolated off  the vessels and the pancreas was stapled with 2 fires of the   Eschelon stapler.    The pancreas had to be mobilized to the midline to get a margin on the mass.  The vessels were then stapled with a vascular load.  Two fires   of this were used as well.  The posterior attachments were then taken   off with the Harmonic scalpel and the spleen was disconnected from the   posterior attachments.  The specimen was then removed from the hand   port.  The area was inspected for hemostasis and there was a small area   at the end of pancreas that was clipped with a clip applier.  This   stopped the bleeding.    The abdomen was copiously irrigated and there was   no sign of any additional bleeding.  Tisseel was placed over the stump of   the pancreas.  The pancreatic specimen was examined and the duct of the   pancreas was seen to be in the staple line.  One of the 5 mm trocars had   to be upsized to a 12 in order to pass the stapler and this fascial   incision was closed with the EndoCatch and a 0 Vicryl.  This was   airtight.  The 19 Blake drain was then passed into the abdomen through   the hand port and pulled out through one of the other 5 mm trocars.   This was placed in the appropriate location.    At this point, the other 5   mm trocar was removed  and the fascia from the hand port was closed with   interrupted #1 with Novafil sutures.  The skin of all the incisions was   then closed with 4-0 Monocryl in a subcuticular fashion and then dressed   with benzoin, Steri-Strips, and soft dressings.  The patient tolerated   the procedure well.  She was extubated and taken to the PACU in stable   condition.  Needle, sponge, and instrument counts were correct x2               Almond Lint, MD

## 2011-08-03 NOTE — H&P (Signed)
Patient presents with   .  Mass     Pancreatic   HISTORY:  Pt presents with history of gallstones and cystic mass in pancreas discovered on workup for hematuria. . She underwent EUS and cyst aspiration. The CEA was high, and the cytology was atypical. She presents to discuss surgery. She does have occasional abdominal bloating and has been avoiding fatty foods since her dx of gallstones several years ago. She had a mother with pancreatic cancer and wants to be aggressive treating this pancreatic mass.    Past Medical History   Diagnosis  Date   .  Hyperlipidemia    .  Hypercholesterolemia    .  Pharyngitis, acute    .  Pancreatic lesion      found on MRI   .  Rectal bleeding    .  Hemorrhoids    .  Blood in urine    .  Contact lens/glasses fitting    .  Menopause    .  Hormone replacement therapy (postmenopausal)      discontinued ten years ago   .  Pancreatic mass      states mother died from pancreatic cancer   .  PONV (postoperative nausea and vomiting)    .  Hypertension     Past Surgical History   Procedure  Date   .  Ovarian cyst removal  2002 - approximate   .  Tonsillectomy  1952   .  Colonoscopy    .  Cesarean section  1977, 1979     x2   .  Eus  06/15/2011     Procedure: UPPER ENDOSCOPIC ULTRASOUND (EUS) LINEAR; Surgeon: Rob Bunting, MD; Location: [facility]; Service: Endoscopy; Laterality: N/A; radial linear    Current Outpatient Prescriptions   Medication  Sig  Dispense  Refill   .  Calcium Citrate-Vitamin D (CALCIUM CITRATE + D PO)  Take 1 capsule by mouth as needed. Vitamin d 500 iu; calcium 630 mg     .  hydrochlorothiazide (HYDRODIURIL) 25 MG tablet  Take 25 mg by mouth daily.     Marland Kitchen  lisinopril (PRINIVIL,ZESTRIL) 40 MG tablet  Take 40 mg by mouth daily.     .  Multiple Vitamins-Minerals (CENTURY SENIOR PO)  Take 1 capsule by mouth daily.     .  simvastatin (ZOCOR) 40 MG tablet  Take 40 mg by mouth at bedtime.      Allergies   Allergen  Reactions   .   Codeine  Nausea And Vomiting   .  Sulfa Antibiotics  Hives   .  Ciprofloxacin      Rash   .  Penicillins  Other (See Comments)     Told since she was a child not to take.    Family History   Problem  Relation  Age of Onset   .  Cancer  Mother       pancreatic    .  Other  Father       respiration problems, bleeding ulcers, cardiac    .  Anesthesia problems  Neg Hx    .  Hypotension  Neg Hx    .  Malignant hyperthermia  Neg Hx    .  Pseudochol deficiency  Neg Hx     History    Social History   .  Marital Status:  Widowed     Spouse Name:  N/A     Number of Children:  N/A   .  Years of Education:  N/A    Social History Main Topics   .  Smoking status:  Never Smoker   .  Smokeless tobacco:  Never Used   .  Alcohol Use:  Yes      rarely   .  Drug Use:  No   .  Sexually Active:  None     REVIEW OF SYSTEMS - PERTINENT POSITIVES ONLY:  12 point review of systems negative other than HPI and PMH  EXAM:  Filed Vitals:    07/03/11 1023   BP:  128/76   Pulse:  68   Temp:  98.1 F (36.7 C)   Resp:  16   Gen: No acute distress. Well nourished and well groomed.  Neurological: Alert and oriented to person, place, and time. Coordination normal.  Head: Normocephalic and atraumatic.  Eyes: Conjunctivae are normal. Pupils are equal, round, and reactive to light. No scleral icterus.  Cardiovascular: Normal rate, regular rhythm.  Respiratory: Effort normal. No respiratory distress. No chest wall tenderness.  Musculoskeletal: Normal range of motion.  Skin: Skin is warm and dry. No rash noted. No diaphoresis. No erythema. No pallor. No clubbing, cyanosis, or edema.  Psychiatric: Normal mood and affect. Behavior is normal. Judgment and thought content normal.   LABORATORY RESULTS:  Available labs are reviewed, CA 19-9 normal   RADIOLOGY RESULTS:  IMPRESSION:  2.7 cm nonenhancing cystic lesion in the pancreatic body, as  described above. The appearance favors a mucinous cystic  neoplasm,  less likely a pseudocyst if there is documented prior history of  pancreatitis.  Pancreatic divisum.  Cholelithiasis, without associated findings to suggest acute  cholecystitis.    ASSESSMENT AND PLAN: 30 minutes spent in counseling regarding the operation .  Cholelithiasis  Plan lap chole in conjunction with above procedure.  The surgical procedure was described to the patient in detail. The patient was given Agricultural engineer. . I discussed the incision type and location, the location of the gallbladder, the anatomy of the bile ducts and arteries, and the typical progression of surgery. I discussed the possibility of converting to an open operation. I advised of the risks of bleeding, infection, damage to other structures (such as the bile duct, intestine or liver), bile leak, need for other procedures or surgeries, and post op diarrhea/constipation. We discussed the risk of blood clot. We discussed the recovery period and post operative restrictions. The patient was advised against taking blood thinners the week before surgery.  Pancreatic mass  Pt has family history of pancreatic cancer, and high CEA values on EUS, solid component of tumor.  Will plan distal pancreatectomy.  Mass is in body of pancreas. May need to open to get good surgical margin. Discussed this with pt.  Reviewed risks of surgery including risk of becoming diabetic or having exocrine insufficiency.  Discussed possible infection, bleeding, damage to adjacent structures, need for prolonged drain placement, possible need for an additional drain.  Discussed post splenectomy sepsis.  Reviewed vaccinations.  Discussed post op recovery and restrictions.  Pt wishes to proceed.

## 2011-08-04 ENCOUNTER — Encounter (HOSPITAL_COMMUNITY): Payer: Self-pay | Admitting: General Surgery

## 2011-08-04 LAB — BASIC METABOLIC PANEL
BUN: 15 mg/dL (ref 6–23)
Creatinine, Ser: 0.76 mg/dL (ref 0.50–1.10)
GFR calc Af Amer: >90 mL/min (ref >90)
GFR calc non Af Amer: 87 mL/min — ABNORMAL LOW (ref >90)
Potassium: 4.5 mEq/L (ref 3.5–5.1)

## 2011-08-04 LAB — CBC
MCH: 30.9 pg (ref 26.0–34.0)
MCV: 91.3 fL (ref 78.0–100.0)
Platelets: 311 10*3/uL (ref 150–400)
RDW: 12.3 % (ref 11.5–15.5)
WBC: 14.9 10*3/uL — ABNORMAL HIGH (ref 4.0–10.5)

## 2011-08-04 MED ORDER — SODIUM CHLORIDE 0.9 % IV BOLUS (SEPSIS): 500.0000 mL | Freq: Once | INTRAVENOUS | Status: DC

## 2011-08-04 MED ORDER — PROMETHAZINE HCL 25 MG/ML IJ SOLN
12.5000 mg | Freq: Four times a day (QID) | INTRAMUSCULAR | Status: DC | PRN
  Administered 2011-08-04: 12.5 mg via INTRAVENOUS
  Filled 2011-08-04: qty 1

## 2011-08-04 MED ORDER — KETOROLAC TROMETHAMINE 15 MG/ML IJ SOLN
15.0000 mg | Freq: Four times a day (QID) | INTRAMUSCULAR | Status: AC
  Administered 2011-08-05 (×2): 15 mg via INTRAVENOUS
  Filled 2011-08-04 (×2): qty 1

## 2011-08-04 MED ORDER — KETOROLAC TROMETHAMINE 15 MG/ML IJ SOLN
15.0000 mg | Freq: Four times a day (QID) | INTRAMUSCULAR | Status: AC | PRN
  Administered 2011-08-05 – 2011-08-06 (×2): 15 mg via INTRAVENOUS
  Filled 2011-08-04 (×3): qty 1

## 2011-08-04 NOTE — Progress Notes (Signed)
RN received report on pt being transferred from ICU; ICU RN reported to receiving RN that MD ordered 500 cc NS bolus, scheduled to be administered at 14:00, that the bolus was given to the pt. Upon receiving pt from ICU via transfer, RN was reviewing pt's orders , saw an unacknowledged order regarding the 500 cc NS bolus but noted that ICU RN did document in  I& O flow sheet that 500 CC NS was administered at 14:00.  Marcelino Duster, RN

## 2011-08-04 NOTE — Progress Notes (Signed)
Valerie Lawson 1226 TRANSFERRED TO 1538- REPORT WAS GIVEN TO JESSICA RN. HER FC WAS D/C'D 1330- UO LOW. I TALKED TO DR Andrey Campanile- NS BOLUS INFUSED. RN MADE AWARE. HER NAUSEA IMPROVED PAST PHENERGAN TRANSFERRED ON 2L O2. NO OTHER CHANGES.

## 2011-08-04 NOTE — Progress Notes (Signed)
1 Day Post-Op  Subjective: Pt with pain and nausea overnight.    Objective: Vital signs in last 24 hours: Temp:  [97.4 F (36.3 C)-98.6 F (37 C)] 98.4 F (36.9 C) (03/01 0800) Pulse Rate:  [59-96] 65  (03/01 0600) Resp:  [6-20] 18  (03/01 0600) BP: (107-136)/(53-72) 113/70 mmHg (03/01 0600) SpO2:  [93 %-100 %] 100 % (03/01 0600) Arterial Line BP: (105-132)/(45-58) 109/46 mmHg (02/28 1345) Weight:  [196 lb 10.4 oz (89.2 kg)] 196 lb 10.4 oz (89.2 kg) (02/28 1438)    Intake/Output from previous day: 02/28 0701 - 03/01 0700 In: 7503.4 [I.V.:6695.4; IV Piggyback:808] Out: 1360 [Urine:955; Drains:305; Blood:100] Intake/Output this shift:    General appearance: alert and no distress GI: Soft, appropriately tender, drain serosanguinous. dressings ok  Lab Results:   Basename 08/04/11 0315 08/03/11 1536  WBC 14.9* 21.8*  HGB 11.0* 11.7*  HCT 32.5* 34.4*  PLT 311 305   BMET  Basename 08/04/11 0315 08/03/11 1536 08/02/11 0925  NA 137 -- 139  K 4.5 -- 4.4  CL 105 -- 103  CO2 26 -- 30  GLUCOSE 169* -- 106*  BUN 15 -- 16  CREATININE 0.76 0.73 --  CALCIUM 8.1* -- 10.1   PT/INR No results found for this basename: LABPROT:2,INR:2 in the last 72 hours ABG No results found for this basename: PHART:2,PCO2:2,PO2:2,HCO3:2 in the last 72 hours  Studies/Results: No results found.  Anti-infectives: Anti-infectives     Start     Dose/Rate Route Frequency Ordered Stop   08/03/11 0630   ertapenem (INVANZ) 1 g in sodium chloride 0.9 % 50 mL IVPB        1 g 100 mL/hr over 30 Minutes Intravenous 60 min pre-op 08/03/11 0618 08/03/11 0800          Assessment/Plan: s/p Procedure(s) (LRB): LAPAROSCOPIC CHOLECYSTECTOMY (N/A) LAPAROSCOPIC PANCREATECTOMY (N/A) LAPAROSCOPIC SPLENECTOMY (N/A) d/c foley Transfer to floor  LOS: 1 day    [facility] 08/04/2011

## 2011-08-05 LAB — HEMOGLOBIN AND HEMATOCRIT, BLOOD: HCT: 30.3 % — ABNORMAL LOW (ref 36.0–46.0)

## 2011-08-05 NOTE — Progress Notes (Signed)
Patient ID: Valerie Lawson, female   DOB: 05/21/1946, 66 y.o.   MRN: [id number] 2 Days Post-Op  Subjective: Feels somewhat weak. Pain is better today. No nausea but taking just a few clear liquids.  Objective: Vital signs in last 24 hours: Temp:  [98.1 F (36.7 C)-98.6 F (37 C)] 98.3 F (36.8 C) (03/02 0600) Pulse Rate:  [55-89] 89  (03/02 0600) Resp:  [12-18] 18  (03/02 0600) BP: (107-137)/(56-73) 137/73 mmHg (03/02 0600) SpO2:  [89 %-99 %] 89 % (03/02 0600)    Intake/Output from previous day: 03/01 0701 - 03/02 0700 In: 3188 [I.V.:2588; IV Piggyback:500] Out: 1170 [Urine:1030; Drains:140] Intake/Output this shift:    General appearance: alert, fatigued and no distress GI: normal findings: soft, non-tender Incision/Wound:clean and dry without apparent infection JP drainage is bloody but thin  Lab Results:   Basename 08/04/11 0315 08/03/11 1536  WBC 14.9* 21.8*  HGB 11.0* 11.7*  HCT 32.5* 34.4*  PLT 311 305   BMET  Basename 08/04/11 0315 08/03/11 1536 08/02/11 0925  NA 137 -- 139  K 4.5 -- 4.4  CL 105 -- 103  CO2 26 -- 30  GLUCOSE 169* -- 106*  BUN 15 -- 16  CREATININE 0.76 0.73 --  CALCIUM 8.1* -- 10.1     Studies/Results: No results found.  Anti-infectives: Anti-infectives     Start     Dose/Rate Route Frequency Ordered Stop   08/03/11 0630   ertapenem (INVANZ) 1 g in sodium chloride 0.9 % 50 mL IVPB        1 g 100 mL/hr over 30 Minutes Intravenous 60 min pre-op 08/03/11 0618 08/03/11 0800          Assessment/Plan: s/p Procedure(s): LAPAROSCOPIC CHOLECYSTECTOMY LAPAROSCOPIC PANCREATECTOMY LAPAROSCOPIC SPLENECTOMY Stable postop. JP drainage somewhat bloody, will check hemoglobin later today. Just starting clear liquids we'll continue those today. Out of bed and ambulate today.   LOS: 2 days    Valerie Lawson T 08/05/2011

## 2011-08-06 LAB — CBC
MCHC: 34.2 g/dL (ref 30.0–36.0)
MCV: 92.8 fL (ref 78.0–100.0)
Platelets: 283 10*3/uL (ref 150–400)
RDW: 12.9 % (ref 11.5–15.5)
WBC: 17.9 10*3/uL — ABNORMAL HIGH (ref 4.0–10.5)

## 2011-08-06 LAB — GLUCOSE, CAPILLARY
Glucose-Capillary: 108 mg/dL — ABNORMAL HIGH (ref 70–99)
Glucose-Capillary: 106 mg/dL — ABNORMAL HIGH (ref 70–99)

## 2011-08-06 MED ORDER — INSULIN ASPART 100 UNIT/ML ~~LOC~~ SOLN
0.0000 [IU] | Freq: Three times a day (TID) | SUBCUTANEOUS | Status: DC
  Administered 2011-08-09 – 2011-08-10 (×2): 1 [IU] via SUBCUTANEOUS
  Filled 2011-08-06: qty 3

## 2011-08-06 NOTE — Progress Notes (Signed)
Dr Abbey Chatters notified of pt am lab results.  No new orders at this time. MD to see in rounds.

## 2011-08-06 NOTE — Progress Notes (Signed)
Patient ID: Valerie Lawson, female   DOB: February 17, 1946, 66 y.o.   MRN: [id number] 3 Days Post-Op  Subjective: Some pain when moving but none at rest. Tolerating clear liquids without nausea.  Objective: Vital signs in last 24 hours: Temp:  [98 F (36.7 C)-98.4 F (36.9 C)] 98 F (36.7 C) (03/03 0500) Pulse Rate:  [69-80] 70  (03/03 0500) Resp:  [18] 18  (03/03 0500) BP: (131-134)/(65-72) 132/65 mmHg (03/03 0500) SpO2:  [92 %-94 %] 94 % (03/03 0500)    Intake/Output from previous day: 03/02 0701 - 03/03 0700 In: 3382.9 [P.O.:360; I.V.:3022.9] Out: 2425 [Urine:2250; Drains:175] Intake/Output this shift:    General appearance: alert and no distress GI: abnormal findings:  mild tenderness in the upper abdomen Incision/Wound: clean and dry with some bruising. JP drainage is clearing  Lab Results:   Basename 08/06/11 0425 08/05/11 1810 08/04/11 0315  WBC 17.9* -- 14.9*  HGB 9.7* 10.2* --  HCT 28.4* 30.3* --  PLT 283 -- 311   BMET  Basename 08/04/11 0315 08/03/11 1536  NA 137 --  K 4.5 --  CL 105 --  CO2 26 --  GLUCOSE 169* --  BUN 15 --  CREATININE 0.76 0.73  CALCIUM 8.1* --        Studies/Results: No results found.  Anti-infectives: Anti-infectives     Start     Dose/Rate Route Frequency Ordered Stop   08/03/11 0630   ertapenem (INVANZ) 1 g in sodium chloride 0.9 % 50 mL IVPB        1 g 100 mL/hr over 30 Minutes Intravenous 60 min pre-op 08/03/11 0618 08/03/11 0800          Assessment/Plan: s/p Procedure(s): LAPAROSCOPIC CHOLECYSTECTOMY LAPAROSCOPIC PANCREATECTOMY LAPAROSCOPIC SPLENECTOMY  Stable postop. Mild acute blood loss anemia. Will advance to full liquid diet. Hyperglycemia. Will start SSI   LOS: 3 days    Kaithlyn Teagle T 08/06/2011

## 2011-08-07 ENCOUNTER — Inpatient Hospital Stay (HOSPITAL_COMMUNITY): Payer: Medicare Other

## 2011-08-07 LAB — BASIC METABOLIC PANEL
BUN: 8 mg/dL (ref 6–23)
Calcium: 8.1 mg/dL — ABNORMAL LOW (ref 8.4–10.5)
Creatinine, Ser: 0.59 mg/dL (ref 0.50–1.10)
GFR calc Af Amer: >90 mL/min (ref >90)
GFR calc non Af Amer: >90 mL/min (ref >90)
Glucose, Bld: 97 mg/dL (ref 70–99)
Potassium: 3.2 mEq/L — ABNORMAL LOW (ref 3.5–5.1)

## 2011-08-07 LAB — GLUCOSE, CAPILLARY: Glucose-Capillary: 81 mg/dL (ref 70–99)

## 2011-08-07 LAB — CBC
HCT: 29.5 % — ABNORMAL LOW (ref 36.0–46.0)
MCH: 31.1 pg (ref 26.0–34.0)
MCHC: 34.2 g/dL (ref 30.0–36.0)
RDW: 12.7 % (ref 11.5–15.5)

## 2011-08-07 MED ORDER — PANTOPRAZOLE SODIUM 20 MG PO TBEC
20.0000 mg | DELAYED_RELEASE_TABLET | Freq: Every day | ORAL | Status: DC
  Administered 2011-08-07 – 2011-08-11 (×5): 20 mg via ORAL
  Filled 2011-08-07 (×5): qty 1

## 2011-08-07 MED ORDER — FUROSEMIDE 40 MG PO TABS
40.0000 mg | ORAL_TABLET | Freq: Once | ORAL | Status: AC
  Administered 2011-08-07: 40 mg via ORAL
  Filled 2011-08-07 (×2): qty 1

## 2011-08-07 MED ORDER — POTASSIUM CHLORIDE CRYS ER 20 MEQ PO TBCR
40.0000 meq | EXTENDED_RELEASE_TABLET | Freq: Once | ORAL | Status: AC
  Administered 2011-08-07: 40 meq via ORAL
  Filled 2011-08-07 (×2): qty 2

## 2011-08-07 MED ORDER — DM-GUAIFENESIN ER 30-600 MG PO TB12
1.0000 | ORAL_TABLET | Freq: Two times a day (BID) | ORAL | Status: DC
  Administered 2011-08-07 – 2011-08-11 (×9): 1 via ORAL
  Filled 2011-08-07 (×11): qty 1

## 2011-08-07 MED ORDER — LISINOPRIL 40 MG PO TABS
40.0000 mg | ORAL_TABLET | Freq: Every day | ORAL | Status: DC
  Administered 2011-08-07 – 2011-08-11 (×5): 40 mg via ORAL
  Filled 2011-08-07 (×5): qty 1

## 2011-08-07 MED ORDER — OXYCODONE-ACETAMINOPHEN 5-325 MG PO TABS
1.0000 | ORAL_TABLET | ORAL | Status: DC | PRN
  Administered 2011-08-07 – 2011-08-09 (×5): 1 via ORAL
  Filled 2011-08-07 (×6): qty 1

## 2011-08-07 NOTE — Progress Notes (Signed)
Patient ID: Valerie Lawson, female   DOB: 1946-02-16, 66 y.o.   MRN: [id number] 4 Days Post-Op  Subjective: Some shortness of breath.  Pain when coughing.  Objective: Vital signs in last 24 hours: Temp:  [98 F (36.7 C)-99 F (37.2 C)] 98 F (36.7 C) (03/04 0639) Pulse Rate:  [64-67] 64  (03/04 0639) Resp:  [18] 18  (03/04 0639) BP: (137-147)/(64-80) 137/74 mmHg (03/04 0639) SpO2:  [95 %] 95 % (03/04 0639) Last BM Date: 08/05/11  Intake/Output from previous day: 03/03 0701 - 03/04 0700 In: 3531.3 [P.O.:600; I.V.:2931.3] Out: 1825 [Urine:1800; Drains:25] Intake/Output this shift:    General appearance: alert and no distress GI: abnormal findings:  mild tenderness in the upper abdomen Incision/Wound: clean and dry with some bruising. JP drainage is clearing Drain looks like old blood.  Lab Results:   Basename 08/07/11 0350 08/06/11 0425  WBC 15.0* 17.9*  HGB 10.1* 9.7*  HCT 29.5* 28.4*  PLT 309 283   BMET  Basename 08/07/11 0350  NA 138  K 3.2*  CL 103  CO2 29  GLUCOSE 97  BUN 8  CREATININE 0.59  CALCIUM 8.1*        Studies/Results: No results found.  Anti-infectives: Anti-infectives     Start     Dose/Rate Route Frequency Ordered Stop   08/03/11 0630   ertapenem (INVANZ) 1 g in sodium chloride 0.9 % 50 mL IVPB        1 g 100 mL/hr over 30 Minutes Intravenous 60 min pre-op 08/03/11 0618 08/03/11 0800          Assessment/Plan: s/p Procedure(s): LAPAROSCOPIC CHOLECYSTECTOMY LAPAROSCOPIC PANCREATECTOMY LAPAROSCOPIC SPLENECTOMY  Advance to carb controlled diet Oral pain meds. CXR to eval shortness of breath. Mucinex to thin secretions.   LOS: 4 days    [facility] 08/07/2011

## 2011-08-08 DIAGNOSIS — E8779 Other fluid overload: Secondary | ICD-10-CM

## 2011-08-08 DIAGNOSIS — E876 Hypokalemia: Secondary | ICD-10-CM

## 2011-08-08 LAB — GLUCOSE, CAPILLARY
Glucose-Capillary: 98 mg/dL (ref 70–99)
Glucose-Capillary: 109 mg/dL — ABNORMAL HIGH (ref 70–99)

## 2011-08-08 MED ORDER — OXYCODONE-ACETAMINOPHEN 5-325 MG PO TABS: 1.0000 | ORAL_TABLET | ORAL | Status: AC | PRN

## 2011-08-08 MED ORDER — FUROSEMIDE 40 MG PO TABS
40.0000 mg | ORAL_TABLET | Freq: Once | ORAL | Status: AC
  Administered 2011-08-08: 40 mg via ORAL
  Filled 2011-08-08: qty 1

## 2011-08-08 MED ORDER — POTASSIUM CHLORIDE CRYS ER 20 MEQ PO TBCR
40.0000 meq | EXTENDED_RELEASE_TABLET | Freq: Every day | ORAL | Status: DC
  Administered 2011-08-08: 40 meq via ORAL
  Filled 2011-08-08 (×2): qty 2

## 2011-08-08 NOTE — Plan of Care (Addendum)
Problem: Food- and Nutrition-Related Knowledge Deficit (NB-1.1) Goal: Nutrition education Formal process to instruct or train a patient/client in a skill or to impart knowledge to help patients/clients voluntarily manage or modify food choices and eating behavior to maintain or improve health.  Outcome: Completed/Met Date Met:  08/08/11 Reviewed chart. Patient is a 66 y.o. Female admitted with history of gallstones and cystic mass in pancreas. Current weight is 196 lb (89.2 kg). BMI is 31.74, classifying patient as class I obese. CBG's are > 100, and HgA1c is 6.0. Used AND Nutrition Care Manual "Type 2 DM Nutrition Therapy" handout to guide education. Dietary recall showed patient is a member of [employer] and uses their point system for meal choices. Patient reported consuming fish and chicken as protein choices, as well as incorporating fruits and vegetables into meals. Reviewed limiting sugary beverages/sweets, the "Healthy Plate" meal guidelines, and carbohydrate counting. Patient verbalized understanding of what foods raised blood sugars, and asked good questions throughout education. Was concerned about whether she would have to check her blood sugars after discharge, referred patient to her doctor for further discharge plans. Overall compliance to dietary modifications seems good as patient currently follows many of the guidelines. No additional interventions at this time.  Christia Reading, Student  [facility] Pager: [phone]

## 2011-08-08 NOTE — Discharge Instructions (Signed)
[facility]      [facility], [place] [phone]  ABDOMINAL SURGERY: POST OP INSTRUCTIONS  Always review your discharge instruction sheet given to you by the facility where your surgery was performed.  IF YOU HAVE DISABILITY OR FAMILY LEAVE FORMS, YOU MUST BRING THEM TO THE OFFICE FOR PROCESSING.  PLEASE DO NOT GIVE THEM TO YOUR DOCTOR.  1. A prescription for pain medication may be given to you upon discharge.  Take your pain medication as prescribed, if needed.  If narcotic pain medicine is not needed, then you may take acetaminophen (Tylenol) or ibuprofen (Advil) as needed. 2. Take your usually prescribed medications unless otherwise directed. 3. If you need a refill on your pain medication, please contact your pharmacy. They will contact our office to request authorization.  Prescriptions will not be filled after 5pm or on week-ends. 4. You should follow a light diet the first few days after arrival home, such as soup and crackers, pudding, etc.unless your doctor has advised otherwise. A high-fiber, low fat diet can be resumed as tolerated.   Be sure to include lots of fluids daily. Most patients will experience some swelling and bruising on the chest and neck area.  Ice packs will help.  Swelling and bruising can take several days to resolve 5. Most patients will experience some swelling and bruising in the area of the incision. Ice pack will help. Swelling and bruising can take several days to resolve..  6. It is common to experience some constipation if taking pain medication after surgery.  Increasing fluid intake and taking a stool softener will usually help or prevent this problem from occurring.  A mild laxative (Milk of Magnesia or Miralax) should be taken according to package directions if there are no bowel movements after 48 hours. 7.  You may have steri-strips (small skin tapes) in place directly over the incision.  These strips should be left on the skin for 7-10 days.  If your  surgeon used skin glue on the incision, you may shower in 24 hours.  The glue will flake off over the next 2-3 weeks.  Any sutures or staples will be removed at the office during your follow-up visit. You may find that a light gauze bandage over your incision may keep your staples from being rubbed or pulled. You may shower and replace the bandage daily. 8. ACTIVITIES:  You may resume regular (light) daily activities beginning the next day--such as daily self-care, walking, climbing stairs--gradually increasing activities as tolerated.  You may have sexual intercourse when it is comfortable.  Refrain from any heavy lifting or straining until approved by your doctor. a. You may drive when you no longer are taking prescription pain medication, you can comfortably wear a seatbelt, and you can safely maneuver your car and apply brakes b. Return to Work: _________4-8 weeks (to be determined)__________________________ 9. You should see your doctor in the office for a follow-up appointment approximately two weeks after your surgery.  Make sure that you call for this appointment within a day or two after you arrive home to insure a convenient appointment time. OTHER INSTRUCTIONS:  _____________________________________________________________ _____________________________________________________________  WHEN TO CALL YOUR DOCTOR: 1. Fever over 101.0 2. Inability to urinate 3. Nausea and/or vomiting 4. Extreme swelling or bruising 5. Continued bleeding from incision. 6. Increased pain, redness, or drainage from the incision. 7. Difficulty swallowing or breathing 8. Muscle cramping or spasms. 9. Numbness or tingling in hands or feet or around lips.  The clinic staff  is available to answer your questions during regular business hours.  Please don't hesitate to call and ask to speak to one of the nurses if you have concerns.  For further questions, please visit [email or web address]

## 2011-08-08 NOTE — Progress Notes (Signed)
Patient ID: Valerie Lawson, female   DOB: [birth year], 66 y.o.   MRN: [id number]  5 Days Post-Op  Subjective: Shortness of breath improved with lasix, but not completely better.  Pt still on oxygen.  CXR with small pleural effusions bilaterally and prominence of vasculature.  Still having some nausea, but was able to tolerate a diet yesterday.  Objective: Vital signs in last 24 hours: Temp:  [98 F (36.7 C)-99.7 F (37.6 C)] 98 F (36.7 C) (03/05 0548) Pulse Rate:  [63-73] 69  (03/05 0548) Resp:  [16-18] 16  (03/05 0548) BP: (150-163)/(77-80) 152/80 mmHg (03/05 0548) SpO2:  [96 %-98 %] 98 % (03/05 0548) Last BM Date: 08/07/11  Intake/Output from previous day: 03/04 0701 - 03/05 0700 In: 240 [P.O.:240] Out: 1855 [Urine:1850; Drains:5] Intake/Output this shift:    General appearance: alert and no distress GI: abnormal findings:  mild tenderness in the upper abdomen Incision/Wound: clean and dry with some bruising. JP drainage is clearing Drain looks serosanguinous.  Lab Results:   Basename 08/07/11 0350 08/06/11 0425  WBC 15.0* 17.9*  HGB 10.1* 9.7*  HCT 29.5* 28.4*  PLT 309 283   BMET  Basename 08/07/11 0350  NA 138  K 3.2*  CL 103  CO2 29  GLUCOSE 97  BUN 8  CREATININE 0.59  CALCIUM 8.1*     Studies/Results: Dg Chest 2 View  08/07/2011  *RADIOLOGY REPORT*  Clinical Data: Shortness of breath.  CHEST - 2 VIEW  Comparison: 08/02/2011.  Findings: Interval development of bilateral pleural effusions and overlying bibasilar atelectasis or infiltrates.  There is vascular congestion and vascular crowding but no overt pulmonary edema.  The bony thorax is intact.  IMPRESSION:  1.  Bilateral pleural effusions and overlying atelectasis or infiltrates. 2.  Low lung volumes with vascular crowding and moderate central vascular congestion but no overt pulmonary edema.  Original Report Authenticated By: P. Loralie Champagne, M.D.    Anti-infectives: Anti-infectives     Start      Dose/Rate Route Frequency Ordered Stop   08/03/11 0630   ertapenem (INVANZ) 1 g in sodium chloride 0.9 % 50 mL IVPB        1 g 100 mL/hr over 30 Minutes Intravenous 60 min pre-op 08/03/11 0618 08/03/11 0800          Assessment/Plan: s/p Procedure(s): LAPAROSCOPIC CHOLECYSTECTOMY LAPAROSCOPIC PANCREATECTOMY LAPAROSCOPIC SPLENECTOMY  Nutrition consult for carb controlled diet. Oral pain meds. Additional dose of lasix today. Potassium replacement for hypokalemia. Mucinex to thin secretions.   LOS: 5 days    [facility] 08/08/2011

## 2011-08-08 NOTE — Clinical Documentation Improvement (Signed)
CHF DOCUMENTATION CLARIFICATION QUERY  THIS DOCUMENT IS NOT A PERMANENT PART OF THE MEDICAL RECORD  TO RESPOND TO THE THIS QUERY, FOLLOW THE INSTRUCTIONS BELOW:  1. If needed, update documentation for the patient's encounter via the notes activity.  2. Access this query again and click edit on the In [employer].  3. After updating, or not, click F2 to complete all highlighted (required) fields concerning your review. Select "additional documentation in the medical record" OR "no additional documentation provided".  4. Click Sign note button.  5. The deficiency will fall out of your In Basket *Please let us know if you are not able to complete this workflow by phone or e-mail (listed below).  Please update your documentation within the medical record to reflect your response to this query.                                                                                    08/08/11  Dear Dr[facility],  In a better effort to capture your patient's severity of illness, reflect appropriate length of stay and utilization of resources, a review of the patient medical record has revealed the following indicators the diagnosis of Heart Failure.    Based on your clinical judgment, please clarify and document in a progress note and/or discharge summary the clinical condition associated with the following supporting information:  In responding to this query please exercise your independent judgment.  The fact that a query is asked, does not imply that any particular answer is desired or expected.   08/08/11 Progr note.Marland KitchenMarland Kitchen"Shortness of breath improved with lasix, but not completely better. Pt still on oxygen. CXR with small pleural effusions bilaterally and prominence of vasculature." For accurate Dx specificity & severity please help clarify if noted condition & Tx can be further specified with a possible, probable, suspected, or likely condition. Thank you  Possible Clinical  Conditions? * Bilateral Plueral Effusions * Acute Pulmonary Edema  . CHF NOS . Left Heart Failure  . Systolic or Diastolic Congestive Heart Failure . Systolic & Diastolic Congestive Heart Failure  . Chronic Systolic or Diastolic Congestive Heart Failure . Chronic Systolic & Diastolic Congestive Heart Failure  . Acute Systolic or Diastolic Congestive Heart Failure . Acute Systolic & Diastolic Congestive Heart Failure  . Acute on Chronic Systolic or Diastolic Congestive Heart Failure . Acute on Chronic Systolic & Diastolic Congestive Heart Failure  Other Condition . Cannot Clinically Determine   Supporting Information: Risk Factors:  Signs & Symptoms: 08/08/11 Progr note.Marland KitchenMarland Kitchen"Shortness of breath improved with lasix, but not completely better. Pt still on oxygen. CXR with small pleural effusions bilaterally and prominence of vasculature."  Diagnostics: Lab: [id number]:JYNW ordered  Radiology: see above note  Treatment: see above note   Reviewed:  no additional documentation provided:08/11/11>no add doc by [email or web address]  Thank You,  Toribio Harbour, RN, BSN, CCDS Certified Clinical Documentation Specialist Pager: [phone]  Health Information Management [place]

## 2011-08-09 LAB — GLUCOSE, CAPILLARY
Glucose-Capillary: 108 mg/dL — ABNORMAL HIGH (ref 70–99)
Glucose-Capillary: 122 mg/dL — ABNORMAL HIGH (ref 70–99)
Glucose-Capillary: 134 mg/dL — ABNORMAL HIGH (ref 70–99)

## 2011-08-09 LAB — BASIC METABOLIC PANEL
CO2: 36 mEq/L — ABNORMAL HIGH (ref 19–32)
Calcium: 8.8 mg/dL (ref 8.4–10.5)
Creatinine, Ser: 0.51 mg/dL (ref 0.50–1.10)
Glucose, Bld: 112 mg/dL — ABNORMAL HIGH (ref 70–99)

## 2011-08-09 LAB — CBC
MCH: 31.1 pg (ref 26.0–34.0)
MCV: 89 fL (ref 78.0–100.0)
Platelets: 475 10*3/uL — ABNORMAL HIGH (ref 150–400)
RBC: 3.28 MIL/uL — ABNORMAL LOW (ref 3.87–5.11)

## 2011-08-09 MED ORDER — FUROSEMIDE 40 MG PO TABS
40.0000 mg | ORAL_TABLET | Freq: Once | ORAL | Status: AC
  Administered 2011-08-09: 40 mg via ORAL
  Filled 2011-08-09: qty 1

## 2011-08-09 MED ORDER — POTASSIUM CHLORIDE 20 MEQ/15ML (10%) PO LIQD
40.0000 meq | Freq: Two times a day (BID) | ORAL | Status: DC
  Administered 2011-08-09 – 2011-08-11 (×3): 40 meq via ORAL
  Filled 2011-08-09 (×6): qty 30

## 2011-08-09 NOTE — Progress Notes (Signed)
6 Days Post-Op  Subjective: Less SOB  Objective: Vital signs in last 24 hours: Temp:  [97.7 F (36.5 C)-99.3 F (37.4 C)] 98.3 F (36.8 C) (03/06 1610) Pulse Rate:  [71-78] 73  (03/06 0637) Resp:  [16-18] 18  (03/06 0637) BP: (148-158)/(77-81) 148/81 mmHg (03/06 0637) SpO2:  [97 %-98 %] 98 % (03/06 0637) Last BM Date: 08/07/11  Intake/Output from previous day: 03/05 0701 - 03/06 0700 In: 740 [P.O.:740] Out: 1850 [Urine:1850] Intake/Output this shift: Total I/O In: -  Out: 800 [Urine:800]  General appearance: alert and cooperative Resp: clear to auscultation bilaterally Cardio: regular rate and rhythm, S1, S2 normal, no murmur, click, rub or gallop Incision/Wound: bruising noted.  JP serous.  Soft.  Lab Results:   Basename 08/09/11 0421 08/07/11 0350  WBC 15.3* 15.0*  HGB 10.2* 10.1*  HCT 29.2* 29.5*  PLT 475* 309   BMET  Basename 08/09/11 0421 08/07/11 0350  NA 135 138  K 3.1* 3.2*  CL 93* 103  CO2 36* 29  GLUCOSE 112* 97  BUN 6 8  CREATININE 0.51 0.59  CALCIUM 8.8 8.1*   PT/INR No results found for this basename: LABPROT:2,INR:2 in the last 72 hours ABG No results found for this basename: PHART:2,PCO2:2,PO2:2,HCO3:2 in the last 72 hours  Studies/Results: Dg Chest 2 View  08/07/2011  *RADIOLOGY REPORT*  Clinical Data: Shortness of breath.  CHEST - 2 VIEW  Comparison: 08/02/2011.  Findings: Interval development of bilateral pleural effusions and overlying bibasilar atelectasis or infiltrates.  There is vascular congestion and vascular crowding but no overt pulmonary edema.  The bony thorax is intact.  IMPRESSION:  1.  Bilateral pleural effusions and overlying atelectasis or infiltrates. 2.  Low lung volumes with vascular crowding and moderate central vascular congestion but no overt pulmonary edema.  Original Report Authenticated By: P. Loralie Champagne, M.D.    Anti-infectives: Anti-infectives     Start     Dose/Rate Route Frequency Ordered Stop   08/03/11  0630   ertapenem (INVANZ) 1 g in sodium chloride 0.9 % 50 mL IVPB        1 g 100 mL/hr over 30 Minutes Intravenous 60 min pre-op 08/03/11 0618 08/03/11 0800          Assessment/Plan: s/p Procedure(s) (LRB): LAPAROSCOPIC CHOLECYSTECTOMY (N/A) LAPAROSCOPIC PANCREATECTOMY (N/A) LAPAROSCOPIC SPLENECTOMY (N/A) Wean oxygen Lasix 40 mg  Hypokalemia   Replace potassium. Ambulate WBC recheck in AM.  LOS: 6 days    Tiernan Millikin A. 08/09/2011

## 2011-08-10 LAB — GLUCOSE, CAPILLARY
Glucose-Capillary: 122 mg/dL — ABNORMAL HIGH (ref 70–99)
Glucose-Capillary: 120 mg/dL — ABNORMAL HIGH (ref 70–99)
Glucose-Capillary: 112 mg/dL — ABNORMAL HIGH (ref 70–99)

## 2011-08-10 LAB — BASIC METABOLIC PANEL
CO2: 37 mEq/L — ABNORMAL HIGH (ref 19–32)
Chloride: 93 mEq/L — ABNORMAL LOW (ref 96–112)
Glucose, Bld: 121 mg/dL — ABNORMAL HIGH (ref 70–99)
Potassium: 3.1 mEq/L — ABNORMAL LOW (ref 3.5–5.1)
Sodium: 137 mEq/L (ref 135–145)

## 2011-08-10 LAB — CBC
HCT: 30.5 % — ABNORMAL LOW (ref 36.0–46.0)
Hemoglobin: 10.6 g/dL — ABNORMAL LOW (ref 12.0–15.0)
MCV: 90.2 fL (ref 78.0–100.0)
RBC: 3.38 MIL/uL — ABNORMAL LOW (ref 3.87–5.11)
RDW: 12.5 % (ref 11.5–15.5)
WBC: 15.4 10*3/uL — ABNORMAL HIGH (ref 4.0–10.5)

## 2011-08-10 MED ORDER — POTASSIUM CHLORIDE 10 MEQ/100ML IV SOLN
10.0000 meq | INTRAVENOUS | Status: AC
  Administered 2011-08-10 (×3): 10 meq via INTRAVENOUS
  Filled 2011-08-10 (×3): qty 100

## 2011-08-10 NOTE — Progress Notes (Signed)
7 Days Post-Op  Subjective: Nausea overnight.  Objective: Vital signs in last 24 hours: Temp:  [98.2 F (36.8 C)-98.7 F (37.1 C)] 98.2 F (36.8 C) (03/07 0536) Pulse Rate:  [72-80] 72  (03/07 0536) Resp:  [16-18] 16  (03/07 0536) BP: (131-148)/(56-81) 139/77 mmHg (03/07 0536) SpO2:  [92 %-97 %] 97 % (03/07 0536) Last BM Date: 08/09/11  Intake/Output from previous day: 03/06 0701 - 03/07 0700 In: -  Out: 3722 [Urine:3720; Drains:1; Stool:1] Intake/Output this shift: Total I/O In: -  Out: 1100 [Urine:1100]  Resp: clear to auscultation bilaterally Incision/Wound:clean dry intact.  Bruising noted.  JP serous  Oozing around site  Lab Results:   Basename 08/10/11 0425 08/09/11 0421  WBC 15.4* 15.3*  HGB 10.6* 10.2*  HCT 30.5* 29.2*  PLT 541* 475*   BMET  Basename 08/10/11 0425 08/09/11 0421  NA 137 135  K 3.1* 3.1*  CL 93* 93*  CO2 37* 36*  GLUCOSE 121* 112*  BUN 7 6  CREATININE 0.58 0.51  CALCIUM 9.1 8.8   PT/INR No results found for this basename: LABPROT:2,INR:2 in the last 72 hours ABG No results found for this basename: PHART:2,PCO2:2,PO2:2,HCO3:2 in the last 72 hours  Studies/Results: No results found.  Anti-infectives: Anti-infectives     Start     Dose/Rate Route Frequency Ordered Stop   08/03/11 0630   ertapenem (INVANZ) 1 g in sodium chloride 0.9 % 50 mL IVPB        1 g 100 mL/hr over 30 Minutes Intravenous 60 min pre-op 08/03/11 0618 08/03/11 0800          Assessment/Plan: s/p Procedure(s) (LRB): LAPAROSCOPIC CHOLECYSTECTOMY (N/A) LAPAROSCOPIC PANCREATECTOMY (N/A) LAPAROSCOPIC SPLENECTOMY (N/A) Replace IV  Hypokalemia  Replace IV since she could not tolerate PO. Wean oxygen. Check WBC in AM Not ready for discharge  LOS: 7 days    Gloriajean Okun A. 08/10/2011

## 2011-08-11 LAB — GLUCOSE, CAPILLARY: Glucose-Capillary: 106 mg/dL — ABNORMAL HIGH (ref 70–99)

## 2011-08-11 NOTE — Progress Notes (Signed)
Pt being d/c to the home. Pt verbalizes understanding of all d/c instructions and states there are no additional questions. Pt taken to car via wheelchair. Faust Thorington L, RN

## 2011-08-14 ENCOUNTER — Telehealth (INDEPENDENT_AMBULATORY_CARE_PROVIDER_SITE_OTHER): Payer: Self-pay | Admitting: General Surgery

## 2011-08-18 ENCOUNTER — Ambulatory Visit (INDEPENDENT_AMBULATORY_CARE_PROVIDER_SITE_OTHER): Payer: Medicare Other | Admitting: Surgery

## 2011-08-18 ENCOUNTER — Encounter (INDEPENDENT_AMBULATORY_CARE_PROVIDER_SITE_OTHER): Payer: Self-pay | Admitting: Surgery

## 2011-08-18 DIAGNOSIS — K8689 Other specified diseases of pancreas: Secondary | ICD-10-CM

## 2011-08-18 DIAGNOSIS — K8020 Calculus of gallbladder without cholecystitis without obstruction: Secondary | ICD-10-CM

## 2011-08-18 DIAGNOSIS — K649 Unspecified hemorrhoids: Secondary | ICD-10-CM

## 2011-08-18 DIAGNOSIS — K869 Disease of pancreas, unspecified: Secondary | ICD-10-CM

## 2011-08-18 NOTE — Progress Notes (Signed)
[facility]  Ovidio Kin, MD,  FACS [address] [place]  [address] [place], [place]    [address] Phone:  [phone] FAX:  [phone]   Re:   Valerie Lawson DOB:   [birth year] MRN:   [id number]  Urgent Office  ASSESSMENT AND PLAN: 1.  Post op - lap chole, pancreatectomy/splenectomy - 08/03/2011 - Dr. Marilynne Halsted.  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  Has drain in that is bothering the patient and is not functioning.  So I removed it.  She has an appointment with [facility] - 08/22/2011  HISTORY OF PRESENT ILLNESS: Chief Complaint  Patient presents with  . Follow-up    Lap cholecystectomy, distal pancreatectomy, splenectomy - 08/03/11   Valerie Lawson is a 66 y.o. (DOB: 1945/10/26)  white female who is a patient of Adaku Nnodi, MD, MD and comes to me today for follow up of pancreatectomy/splenectomy/cholecystectomy.  Has drain in that is bothering the patient.  Actually, the drain has not worked since she went home with essentially nothing in the bulb.  In fact, when the patient came in today, she had disconnected the bulb and it was in a bag.  So much for "closed suction" drainage. She is having no pain, no fever, no nausea, and no abdominal symptoms.   PHYSICAL EXAM: BP 116/78  Pulse 68  Temp(Src) 97.8 F (36.6 C) (Temporal)  Resp 16  Ht 5' 6.5" (1.689 m)  Wt 171 lb 3.2 oz (77.656 kg)  BMI 27.22 kg/m2  Abdomen: Midline incision healed. Has bruise.  Drain in the LUQ is disconnected. Nothing is coming out.  So I pulled the drain.  Once I pulled the drain, I was able to express about 50 cc of creamy yellow drainage.  I told her she may get some more drainage.  DATA REVIEWED: Path report to patient.  Ovidio Kin, MD, FACS Office:  [phone]

## 2011-08-21 NOTE — Discharge Summary (Signed)
Physician Discharge Summary  Patient ID: Valerie Lawson MRN: [id number] DOB/AGE: Feb 17, 1946 66 y.o.  Admit date: 08/03/2011 Discharge date: 08/21/2011  Admission Diagnoses:  Panc mass is principal dx Patient Active Problem List  Diagnoses  . Pancreatic mass  . Hemorrhoids  . Cholelithiasis     Discharge Diagnoses:  Same as above  Discharged Condition: improved  Hospital Course:  Pt was admitted to the hospital following lap distal pancreatectomy/splenectomy.  She was admitted to the stepdown unit overnight.  She did well and was transferred to the floor.  She had an acute blood loss anemia that was improving prior to discharge.  She was having some hyperglycemia as expected given the amount of pancreas that was removed.  She had a day of shortness of breath that resolved with lasix and mucinex.  She went home with a drain for concerns about the appearance of the output.  She was able to transition to oral narcotics, urinate without a foley, and ileus resolved such that she was able to eat regular diet.    Consults: None  Significant Diagnostic Studies: Small bilateral pleural effusions  Treatments: Surgery as above.    Discharge Exam: Blood pressure 130/76, pulse 78, temperature 98.2 F (36.8 C), temperature source Oral, resp. rate 18, height 5\' 6"  (1.676 m), weight 196 lb 10.4 oz (89.2 kg), SpO2 94.00%. General appearance: alert, cooperative and no distress GI: soft, non distended, approp tender. Extremities: extremities normal, atraumatic, no cyanosis or edema Bruising around incisions.    Disposition: 01-Home or Self Care  Discharge Orders    Future Appointments: Provider: Department: Dept Phone: Center:   08/22/2011 3:15 PM Almond Lint, MD [facility] [phone] None     Future Orders Please Complete By Expires   Diet - low sodium heart healthy      Increase activity slowly      Discharge instructions      Comments:   Empty and record JP drain twice a day   Discharge wound care:      Comments:   Shower and stay clean.  Avoid getting JP underwater in tub but shower OK.  Apply neosporin around tube after shower and every day.     Medication List  As of 08/21/2011  6:41 PM   TAKE these medications         acetaminophen 500 MG tablet   Commonly known as: TYLENOL   Take 500 mg by mouth every 6 (six) hours as needed.      CALCIUM CITRATE + D PO   Take 1 capsule by mouth as needed. Vitamin d 500 iu; calcium 630 mg      CENTURY SENIOR PO   Take 1 capsule by mouth daily.      COLD-EEZE 13.3 MG Lozg   Generic drug: Zinc Gluconate   Use as directed 1 lozenge in the mouth or throat every 4 (four) hours as needed. Sore throat      COLD-EEZE MT   Use as directed 1 spray in the mouth or throat every 4 (four) hours as needed. Sore throat      hydrochlorothiazide 25 MG tablet   Commonly known as: HYDRODIURIL   Take 25 mg by mouth daily after breakfast.      hydrocortisone 2.5 % rectal cream   Commonly known as: ANUSOL-HC   Place 1 application rectally 2 (two) times daily.      hydrocortisone cream 0.5 %   Apply topically 2 (two) times daily.  ibuprofen 200 MG tablet   Commonly known as: ADVIL,MOTRIN   Take 200 mg by mouth every 6 (six) hours as needed. Pain      lisinopril 40 MG tablet   Commonly known as: PRINIVIL,ZESTRIL   Take 40 mg by mouth daily after breakfast.      OPTI-FREE REWETTING DROPS Soln   Place 1 drop into both eyes 2 (two) times daily as needed. Dry eyes      simvastatin 40 MG tablet   Commonly known as: ZOCOR   Take 40 mg by mouth at bedtime.           Follow-up Information    Follow up with [facility], MD. Schedule an appointment as soon as possible for a visit in 5 days.   Contact information:   [employer] [facility] [address] [place] [address] [phone]       Follow up with Gretel Acre, MD in 3 weeks. (to evaluate blood sugars)    Contact  information:   [address] [place] [address] [phone]          Signed: Almond Lint 08/21/2011, 6:41 PM

## 2011-08-22 ENCOUNTER — Ambulatory Visit (INDEPENDENT_AMBULATORY_CARE_PROVIDER_SITE_OTHER): Payer: Medicare Other | Admitting: General Surgery

## 2011-08-22 ENCOUNTER — Encounter (INDEPENDENT_AMBULATORY_CARE_PROVIDER_SITE_OTHER): Payer: Self-pay | Admitting: General Surgery

## 2011-08-22 VITALS — BP 116/72 | HR 68 | Temp 97.4°F | Resp 18 | Ht 66.5 in | Wt 169.6 lb

## 2011-08-22 DIAGNOSIS — R7302 Impaired glucose tolerance (oral): Secondary | ICD-10-CM

## 2011-08-22 DIAGNOSIS — K8689 Other specified diseases of pancreas: Secondary | ICD-10-CM

## 2011-08-22 DIAGNOSIS — K8681 Exocrine pancreatic insufficiency: Secondary | ICD-10-CM

## 2011-08-22 DIAGNOSIS — D136 Benign neoplasm of pancreas: Secondary | ICD-10-CM

## 2011-08-22 DIAGNOSIS — R7309 Other abnormal glucose: Secondary | ICD-10-CM

## 2011-08-22 HISTORY — DX: Impaired glucose tolerance (oral): R73.02

## 2011-08-22 HISTORY — DX: Other specified diseases of pancreas: K86.89

## 2011-08-22 MED ORDER — PANCRELIPASE (LIP-PROT-AMYL) 24000-76000 UNITS PO CPEP: ORAL_CAPSULE | ORAL | Status: DC

## 2011-08-22 NOTE — Patient Instructions (Signed)
Take creon as directed on script.  May wean or d/c if no change in symptoms  See primary doctor about glucose intolerance.  Follow up MR in 1 year.

## 2011-08-22 NOTE — Progress Notes (Signed)
HISTORY: Patient is status post laparoscopic distal pancreatectomy and splenectomy 3 weeks ago. She also had a cholecystectomy at the time of surgery. She did fairly well other than some weakness and an ileus. She was discharged to home with her drain. Her drain did start to bleed and was removed. She has not had any fevers or chills since that time. She does have some early satiety. She has had some loose stools since the time of surgery. She thinks that they are somewhat greasy. She denies jaundice or difficulty keeping down. She has not been continuing to take any pain medication.    EXAM: General: Alert and oriented x3. The patient is well dressed. She is wearing makeup and her hair is fixed.  Incision:   Incision is well-healed. She has no tenderness at the site of the incision and no sign of hernia. Her abdomen is soft and nondistended.  PATHOLOGY: 1. Liver, wedge biopsy BENIGN FIBROTIC NODULE. THERE IS NO EVIDENCE OF MALIGNANCY. 2. Pancreas, resection MUCINOUS CYSTADENOMA. NO EVIDENCE OF BORDERLINE CHANGE OR MALIGNANCY. MARGINS NOT INVOLVED. 3. Gallbladder CHRONIC CHOLECYSTITIS.CHOLELITHIASIS.   ASSESSMENT AND PLAN:   Mucinous cystadenoma of pancreas No evidence of pancreatic leak Doing well post op Ok to swim/drive. Wait for heavy lifting or strenuous activity for an additional 3 weeks.  Glucose intolerance (impaired glucose tolerance) Patient had impaired glucose tolerance after surgery. This is uncommon after a partial pancreatectomy. Her sugars dramatically improved in the hospital after going off IV fluids and starting on a carbohydrate controlled diet. I will refer her back to Dr. Ihor Dow to check her blood sugars and decide whether she needs any anti-hyperglycemics.  Exocrine pancreatic insufficiency I have given her a sample of Creon. I recommended 3 capsules with meals and 1 with snacks. If she sees some improvement after the samples she'll get her prescription  filled.    Her diarrhea may also be from her cholecystectomy. She was advised that if the Creon does not improve her diarrhea, she should call and she may require a prescription for Questran.    Maudry Diego, MD Surgical Oncology, General & Endocrine Surgery [facility]  Gretel Acre, MD, MD Gretel Acre, MD

## 2011-08-22 NOTE — Assessment & Plan Note (Signed)
Patient had impaired glucose tolerance after surgery. This is uncommon after a partial pancreatectomy. Her sugars dramatically improved in the hospital after going off IV fluids and starting on a carbohydrate controlled diet. I will refer her back to Dr. Ihor Dow to check her blood sugars and decide whether she needs any anti-hyperglycemics.

## 2011-08-22 NOTE — Assessment & Plan Note (Signed)
No evidence of pancreatic leak Doing well post op Ok to swim/drive. Wait for heavy lifting or strenuous activity for an additional 3 weeks.

## 2011-08-22 NOTE — Assessment & Plan Note (Signed)
I have given her a sample of Creon. I recommended 3 capsules with meals and 1 with snacks. If she sees some improvement after the samples she'll get her prescription filled.

## 2011-09-25 ENCOUNTER — Encounter (INDEPENDENT_AMBULATORY_CARE_PROVIDER_SITE_OTHER): Payer: Self-pay

## 2011-11-21 ENCOUNTER — Encounter (INDEPENDENT_AMBULATORY_CARE_PROVIDER_SITE_OTHER): Payer: Self-pay | Admitting: General Surgery

## 2011-11-21 ENCOUNTER — Ambulatory Visit (INDEPENDENT_AMBULATORY_CARE_PROVIDER_SITE_OTHER): Payer: Medicare Other | Admitting: General Surgery

## 2011-11-21 VITALS — BP 100/68 | HR 65 | Temp 97.2°F | Resp 14 | Ht 66.5 in | Wt 166.8 lb

## 2011-11-21 DIAGNOSIS — K8689 Other specified diseases of pancreas: Secondary | ICD-10-CM

## 2011-11-21 DIAGNOSIS — K8681 Exocrine pancreatic insufficiency: Secondary | ICD-10-CM

## 2011-11-21 DIAGNOSIS — D136 Benign neoplasm of pancreas: Secondary | ICD-10-CM

## 2011-11-21 NOTE — Assessment & Plan Note (Signed)
Doing better.   Diarrhea resolved.   Stop creon with breakfast.  May be able to come off completely.

## 2011-11-21 NOTE — Assessment & Plan Note (Signed)
Doing well.   Will get scan at 1 year.  Follow up in 6 months.

## 2011-11-21 NOTE — Patient Instructions (Signed)
Can try dropping off the creon with breakfast.    Can taper down to no creon if no diarrhea or crampy abdominal pain develops.  Follow up with me in 6 months.

## 2011-11-21 NOTE — Progress Notes (Signed)
HISTORY: Patient is a 66 year old female who is approximately 4 months after a laparoscopic distal pancreatectomy and splenectomy for mucinous cystadenoma of the pancreas. She was having diarrhea at her last examination. I started her on Creon. Her diarrhea is resolved and she is actually having some constipation. She has come down the Creon to one capsule with each meal. She is taking Colace. She does not have any significant pain in her incision on daily basis however she did experience some discomfort during water aerobics when trying to reach backwards with her legs to grab float. Her blood sugars have been followed up by Dr. Ihor Dow, and her fasting glucose was 101. She has not required any treatment for this. She is watching her carbohydrate intake.   PERTINENT REVIEW OF SYSTEMS: constipation   EXAM: Head: Normocephalic and atraumatic.  Eyes:  Conjunctivae are normal. Pupils are equal, round, and reactive to light. No scleral icterus.  Neck:  Normal range of motion. Neck supple. No tracheal deviation present. No thyromegaly present.  Resp: No respiratory distress, normal effort. Abd:  Abdomen is soft, non distended and non tender. No masses are palpable.  There is no rebound and no guarding. Midline incision well healed.  No hernia present.   Neurological: Alert and oriented to person, place, and time. Coordination normal.  Skin: Skin is warm and dry. No rash noted. No diaphoretic. No erythema. No pallor.  Psychiatric: Normal mood and affect. Normal behavior. Judgment and thought content normal.      ASSESSMENT AND PLAN:   Mucinous cystadenoma of pancreas Doing well.   Will get scan at 1 year.  Follow up in 6 months.    Exocrine pancreatic insufficiency Doing better.   Diarrhea resolved.   Stop creon with breakfast.  May be able to come off completely.          Maudry Diego, MD Surgical Oncology, General & Endocrine Surgery [facility]  Gretel Acre, MD Gretel Acre, MD

## 2012-01-09 ENCOUNTER — Encounter: Payer: Self-pay | Admitting: Obstetrics and Gynecology

## 2012-01-22 ENCOUNTER — Encounter: Payer: Self-pay | Admitting: Gynecology

## 2012-01-22 DIAGNOSIS — G43909 Migraine, unspecified, not intractable, without status migrainosus: Secondary | ICD-10-CM | POA: Insufficient documentation

## 2012-01-24 ENCOUNTER — Encounter: Payer: Medicare Other | Admitting: Obstetrics and Gynecology

## 2012-01-31 ENCOUNTER — Ambulatory Visit (INDEPENDENT_AMBULATORY_CARE_PROVIDER_SITE_OTHER): Payer: Medicare Other | Admitting: Obstetrics and Gynecology

## 2012-01-31 ENCOUNTER — Encounter: Payer: Self-pay | Admitting: Obstetrics and Gynecology

## 2012-01-31 VITALS — BP 120/74 | Ht 66.5 in | Wt 166.0 lb

## 2012-01-31 DIAGNOSIS — L729 Follicular cyst of the skin and subcutaneous tissue, unspecified: Secondary | ICD-10-CM

## 2012-01-31 DIAGNOSIS — K625 Hemorrhage of anus and rectum: Secondary | ICD-10-CM

## 2012-01-31 DIAGNOSIS — Z780 Asymptomatic menopausal state: Secondary | ICD-10-CM

## 2012-01-31 DIAGNOSIS — K649 Unspecified hemorrhoids: Secondary | ICD-10-CM

## 2012-01-31 DIAGNOSIS — R351 Nocturia: Secondary | ICD-10-CM

## 2012-01-31 DIAGNOSIS — N952 Postmenopausal atrophic vaginitis: Secondary | ICD-10-CM

## 2012-01-31 DIAGNOSIS — L723 Sebaceous cyst: Secondary | ICD-10-CM

## 2012-01-31 NOTE — Progress Notes (Signed)
Patient came to see me today for further followup. Last year she brought to my attention a small cyst on her left buttock that we thought was either a sebaceous cyst or small lipoma. She does not feel it has changed. Since I last saw her her she had a mass discovered on her pancreas. She had a mother who died of pancreatic cancer. She underwent surgery here that was done laparoscopically and have her gallbladder, pancreas and spleen removed for a benign tumor. She has done well postoperatively. She has had some intermittent rectal bleeding and assumed she had hemorrhoids. It been several years since her last colonoscopy. It was done by Dr. Barnett Abu. She also notices that she sometimes has to get to the bathroom in a hurry to have a bowel movement or she has an accident. She does have nocturia x2 without urinary urgency, dysuria or incontinence. She has very mild menopausal symptoms. She is not aware of vaginal dryness. She is not having vaginal bleeding. She is not having pelvic pain. She had a normal mammogram this year. She had normal bone density in 2009. She has always had normal Pap smears. She had a Pap smear in 2012.  ROS: 12 system review done. Pertinent positives above  Physical examination:Kim Julian Reil present. HEENT within normal limits. Neck: Thyroid not large. No masses. Supraclavicular nodes: not enlarged. Breasts: Examined in both sitting and lying  position. No skin changes and no masses. Abdomen: Soft no guarding rebound or masses or hernia. Pelvic: External: Within normal limits. BUS: Within normal limits. Vaginal:within normal limits. Poor  estrogen effect. No evidence of cystocele rectocele or enterocele. Cervix: clean. Uterus: Normal size and shape. Adnexa: No masses. Rectovaginal exam: Confirmatory and negative. Extremities: Within normal limits. On left buttock there is a skin lesion of approximately one third of a centimeter which is not firm and is most consistent with a sebaceous  cyst or a lipoma. It is unchanged from her last visit here in January, 2012.  Assessment: #1. Asymptomatic atrophic vaginitis #2. Small benign cyst of left buttock #3. Mild menopausal symptoms #4. Nocturia #5. Occasional rectal bleeding with urgency to pass  Stool.  Plan: Continue yearly mammograms. Bone density. Appointment with Dr. Reece Agar for assessment of rectal bleeding. Observation of nocturia.The new Pap smear guidelines were discussed with the patient. No pap done.

## 2012-01-31 NOTE — Patient Instructions (Signed)
Schedule  bone density. Schedule  appointment with Dr. Laural Benes to evaluate hemorrhoids.

## 2012-02-22 ENCOUNTER — Ambulatory Visit (INDEPENDENT_AMBULATORY_CARE_PROVIDER_SITE_OTHER): Payer: Medicare Other

## 2012-02-22 DIAGNOSIS — Z780 Asymptomatic menopausal state: Secondary | ICD-10-CM

## 2012-02-22 DIAGNOSIS — M899 Disorder of bone, unspecified: Secondary | ICD-10-CM

## 2012-02-22 DIAGNOSIS — M8580 Other specified disorders of bone density and structure, unspecified site: Secondary | ICD-10-CM

## 2012-06-14 ENCOUNTER — Ambulatory Visit (INDEPENDENT_AMBULATORY_CARE_PROVIDER_SITE_OTHER): Payer: Medicare Other | Admitting: General Surgery

## 2012-06-14 ENCOUNTER — Other Ambulatory Visit (INDEPENDENT_AMBULATORY_CARE_PROVIDER_SITE_OTHER): Payer: Self-pay | Admitting: General Surgery

## 2012-06-14 ENCOUNTER — Encounter (INDEPENDENT_AMBULATORY_CARE_PROVIDER_SITE_OTHER): Payer: Self-pay | Admitting: General Surgery

## 2012-06-14 VITALS — BP 134/76 | HR 68 | Temp 98.0°F | Resp 16 | Ht 66.5 in | Wt 177.6 lb

## 2012-06-14 DIAGNOSIS — R197 Diarrhea, unspecified: Secondary | ICD-10-CM

## 2012-06-14 DIAGNOSIS — D136 Benign neoplasm of pancreas: Secondary | ICD-10-CM

## 2012-06-14 DIAGNOSIS — K8681 Exocrine pancreatic insufficiency: Secondary | ICD-10-CM

## 2012-06-14 DIAGNOSIS — K8689 Other specified diseases of pancreas: Secondary | ICD-10-CM

## 2012-06-14 HISTORY — DX: Diarrhea, unspecified: R19.7

## 2012-06-14 MED ORDER — CHOLESTYRAMINE 4 GM/DOSE PO POWD: 2.0000 g | Freq: Two times a day (BID) | ORAL | Status: AC

## 2012-06-14 NOTE — Assessment & Plan Note (Signed)
Pt had benign mass, but there is risk of recurrence or risk of other pancreatic abnormalities.  Follow up MRI

## 2012-06-14 NOTE — Patient Instructions (Signed)
Try Lanetta Inch for diarrhea  Follow up MRI.  Follow up in 3-6 months.

## 2012-06-14 NOTE — Progress Notes (Signed)
HISTORY: This is a 67 year old female who underwent distal pancreatectomy and splenectomy last February for mucinous cystadenoma of the pancreas.  She has continued to have issues with her bowel function. When I last saw her she was doing mostly better and was trying to come off the Creon. However when she did she had a lot more diarrhea and has cut back on the Creon. She still has episodes of significant urgency and occasional incontinence. She denies changes in her weight. Her energy level is good. She overall feels well other than the GI issues. He states that these are very unpredictable and are not necessarily always after she eats. She denies abdominal pain.   PERTINENT REVIEW OF SYSTEMS: Otherwise negative.     EXAM: Head: Normocephalic and atraumatic.  Eyes:  Conjunctivae are normal. Pupils are equal, round, and reactive to light. No scleral icterus.  Neck:  Normal range of motion. Neck supple. No tracheal deviation present. No thyromegaly present.  Resp: No respiratory distress, normal effort. Abd:  Abdomen is soft, non distended and non tender. No masses are palpable.  There is no rebound and no guarding. No hernia present.   Neurological: Alert and oriented to person, place, and time. Coordination normal.  Skin: Skin is warm and dry. No rash noted. No diaphoretic. No erythema. No pallor.  Psychiatric: Normal mood and affect. Normal behavior. Judgment and thought content normal.      ASSESSMENT AND PLAN:   Diarrhea Add questran to try to see if post cholecystectomy diarrhea is the issue.    Exocrine pancreatic insufficiency Pt continuing on creon  Mucinous cystadenoma of pancreas Pt had benign mass, but there is risk of recurrence or risk of other pancreatic abnormalities.  Follow up MRI      Maudry Diego, MD Surgical Oncology, General & Endocrine Surgery [facility]  Gretel Acre, MD Gretel Acre, MD

## 2012-06-14 NOTE — Assessment & Plan Note (Signed)
Pt continuing on creon

## 2012-06-14 NOTE — Assessment & Plan Note (Signed)
Add questran to try to see if post cholecystectomy diarrhea is the issue.

## 2012-06-20 ENCOUNTER — Ambulatory Visit
Admission: RE | Admit: 2012-06-20 | Discharge: 2012-06-20 | Disposition: A | Discharge status: Home / Self Care | Payer: Medicare Other | Source: Ambulatory Visit | Attending: General Surgery | Admitting: General Surgery

## 2012-06-20 DIAGNOSIS — D136 Benign neoplasm of pancreas: Secondary | ICD-10-CM

## 2012-06-20 MED ORDER — GADOBENATE DIMEGLUMINE 529 MG/ML IV SOLN
15.0000 mL | Freq: Once | INTRAVENOUS | Status: AC | PRN
  Administered 2012-06-20: 15 mL via INTRAVENOUS

## 2012-07-20 ENCOUNTER — Other Ambulatory Visit: Payer: Self-pay

## 2012-08-26 ENCOUNTER — Telehealth (INDEPENDENT_AMBULATORY_CARE_PROVIDER_SITE_OTHER): Payer: Self-pay

## 2012-08-26 ENCOUNTER — Other Ambulatory Visit (INDEPENDENT_AMBULATORY_CARE_PROVIDER_SITE_OTHER): Payer: Self-pay | Admitting: General Surgery

## 2012-08-26 NOTE — Telephone Encounter (Signed)
Pt calling to get a refill on her Creon Rx. Pls call pt ASAP to let her know you refilled the rx.

## 2012-08-26 NOTE — Telephone Encounter (Signed)
Rx refill for Creon w/ 2 refills called to [facility], [address]  Pt is aware.

## 2012-11-19 ENCOUNTER — Telehealth (INDEPENDENT_AMBULATORY_CARE_PROVIDER_SITE_OTHER): Payer: Self-pay | Admitting: General Surgery

## 2012-11-19 NOTE — Telephone Encounter (Signed)
Patient called in wanting to know is she could have a refill on her Creon. Advised patient that Dr.Byerly was out of the office this week and that I would ask another provider in the office to take care of this matter

## 2012-11-22 ENCOUNTER — Telehealth (INDEPENDENT_AMBULATORY_CARE_PROVIDER_SITE_OTHER): Payer: Self-pay

## 2012-11-22 ENCOUNTER — Other Ambulatory Visit (INDEPENDENT_AMBULATORY_CARE_PROVIDER_SITE_OTHER): Payer: Self-pay

## 2012-11-22 DIAGNOSIS — K8689 Other specified diseases of pancreas: Secondary | ICD-10-CM

## 2012-11-22 DIAGNOSIS — K8681 Exocrine pancreatic insufficiency: Secondary | ICD-10-CM

## 2012-11-22 MED ORDER — PANCRELIPASE (LIP-PROT-AMYL) 24000-76000 UNITS PO CPEP: ORAL_CAPSULE | ORAL | Status: AC

## 2012-11-22 NOTE — Telephone Encounter (Signed)
V/M  Dr. Maisie Fus authorized refill for creon 24000 u ph[facility] to [facility] [address] [employer] .

## 2012-12-31 ENCOUNTER — Ambulatory Visit (INDEPENDENT_AMBULATORY_CARE_PROVIDER_SITE_OTHER): Payer: Medicare Other | Admitting: General Surgery

## 2012-12-31 VITALS — BP 130/68 | HR 68 | Resp 16 | Ht 66.5 in | Wt 183.8 lb

## 2012-12-31 DIAGNOSIS — R197 Diarrhea, unspecified: Secondary | ICD-10-CM

## 2012-12-31 DIAGNOSIS — D136 Benign neoplasm of pancreas: Secondary | ICD-10-CM

## 2012-12-31 NOTE — Patient Instructions (Addendum)
Ok to try to wean creon to off if you desire.  Follow up in 18 months.

## 2012-12-31 NOTE — Progress Notes (Signed)
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 She was having issues with diarrhea.  This is better since placing patient on cholestyramine.  She has recent bruise on left upper arm from [employer].     PERTINENT REVIEW OF SYSTEMS: Otherwise negative.     EXAM: Head: Normocephalic and atraumatic.  Eyes:  Conjunctivae are normal. Pupils are equal, round, and reactive to light. No scleral icterus.  Neck:  Normal range of motion. Neck supple. No tracheal deviation present. No thyromegaly present.  Resp: No respiratory distress, normal effort. Abd:  Abdomen is soft, non distended and non tender. No masses are palpable.  There is no rebound and no guarding. No hernia present.   Neurological: Alert and oriented to person, place, and time. Coordination normal.  Skin: Skin is warm and dry. No rash noted. No diaphoretic. No erythema. No pallor.  Psychiatric: Normal mood and affect. Normal behavior. Judgment and thought content normal.      ASSESSMENT AND PLAN:   Diarrhea Improved with cholestyramine.  Advised patient that she could try to wean her Creon if she desires.  This may be unsuccessful.  Over 5 refills. I will see her back in 18 months.  Mucinous cystadenoma of pancreas No evidence of disease. See her back in 18 months       Maudry Diego, MD Surgical Oncology, General & Endocrine Surgery [facility]  NNODI, ADAKU, MD No ref. provider found

## 2012-12-31 NOTE — Assessment & Plan Note (Signed)
No evidence of disease. See her back in 18 months

## 2012-12-31 NOTE — Assessment & Plan Note (Signed)
Improved with cholestyramine.  Advised patient that she could try to wean her Creon if she desires.  This may be unsuccessful.  Over 5 refills. I will see her back in 18 months.

## 2013-01-01 ENCOUNTER — Telehealth (INDEPENDENT_AMBULATORY_CARE_PROVIDER_SITE_OTHER): Payer: Self-pay

## 2013-01-01 ENCOUNTER — Encounter (INDEPENDENT_AMBULATORY_CARE_PROVIDER_SITE_OTHER): Payer: Self-pay

## 2013-01-01 ENCOUNTER — Other Ambulatory Visit (INDEPENDENT_AMBULATORY_CARE_PROVIDER_SITE_OTHER): Payer: Self-pay

## 2013-01-01 DIAGNOSIS — K8681 Exocrine pancreatic insufficiency: Secondary | ICD-10-CM

## 2013-01-01 NOTE — Telephone Encounter (Signed)
Refills for Creon and Questran called to pt's pharmacy.  Pt is aware.

## 2013-01-08 ENCOUNTER — Other Ambulatory Visit: Payer: Self-pay

## 2013-04-10 ENCOUNTER — Other Ambulatory Visit: Payer: Self-pay

## 2013-11-25 ENCOUNTER — Other Ambulatory Visit: Payer: Self-pay | Admitting: Dermatology

## 2014-01-09 ENCOUNTER — Telehealth (INDEPENDENT_AMBULATORY_CARE_PROVIDER_SITE_OTHER): Payer: Self-pay | Admitting: General Surgery

## 2014-01-09 NOTE — Telephone Encounter (Signed)
Ok to refill both?? 

## 2014-01-09 NOTE — Telephone Encounter (Signed)
Patient called in wanting to know if we would refill her Rx for cholestyramine and Creon.  Informed her that I would send this request to Dr. Barry Dienes and if they decide to refill it then they will send it to her pharmacy.

## 2014-01-13 ENCOUNTER — Telehealth (INDEPENDENT_AMBULATORY_CARE_PROVIDER_SITE_OTHER): Payer: Self-pay

## 2014-01-13 NOTE — Telephone Encounter (Signed)
No notes in encounter. 

## 2014-01-13 NOTE — Telephone Encounter (Signed)
Pt made aware refills called to [facility] today.

## 2014-04-06 ENCOUNTER — Encounter (INDEPENDENT_AMBULATORY_CARE_PROVIDER_SITE_OTHER): Payer: Self-pay | Admitting: General Surgery

## 2014-07-31 ENCOUNTER — Encounter (INDEPENDENT_AMBULATORY_CARE_PROVIDER_SITE_OTHER): Payer: Self-pay | Admitting: General Surgery

## 2014-07-31 ENCOUNTER — Other Ambulatory Visit (INDEPENDENT_AMBULATORY_CARE_PROVIDER_SITE_OTHER): Payer: Self-pay | Admitting: General Surgery

## 2014-07-31 DIAGNOSIS — K862 Cyst of pancreas: Secondary | ICD-10-CM

## 2014-08-03 ENCOUNTER — Encounter: Payer: Self-pay | Admitting: Gastroenterology

## 2014-08-04 ENCOUNTER — Encounter: Payer: Medicare Other | Attending: Family Medicine

## 2014-08-04 VITALS — Ht 66.0 in | Wt 186.6 lb

## 2014-08-04 DIAGNOSIS — Z713 Dietary counseling and surveillance: Secondary | ICD-10-CM | POA: Insufficient documentation

## 2014-08-04 DIAGNOSIS — E119 Type 2 diabetes mellitus without complications: Secondary | ICD-10-CM | POA: Insufficient documentation

## 2014-08-05 ENCOUNTER — Ambulatory Visit
Admission: RE | Admit: 2014-08-05 | Discharge: 2014-08-05 | Disposition: A | Discharge status: Home / Self Care | Payer: PRIVATE HEALTH INSURANCE | Source: Ambulatory Visit | Attending: General Surgery | Admitting: General Surgery

## 2014-08-05 DIAGNOSIS — K862 Cyst of pancreas: Secondary | ICD-10-CM

## 2014-08-05 MED ORDER — GADOBENATE DIMEGLUMINE 529 MG/ML IV SOLN
17.0000 mL | Freq: Once | INTRAVENOUS | Status: AC | PRN
  Administered 2014-08-05: 17 mL via INTRAVENOUS

## 2014-08-05 NOTE — Progress Notes (Signed)

## 2014-08-06 NOTE — Progress Notes (Signed)
Quick Note:  Please let patient know that MRI did not show any new pancreatic lesions. Also, otherwise essentially normal. ______

## 2014-08-11 DIAGNOSIS — E119 Type 2 diabetes mellitus without complications: Secondary | ICD-10-CM | POA: Diagnosis not present

## 2014-08-11 NOTE — Progress Notes (Signed)

## 2014-08-18 DIAGNOSIS — E119 Type 2 diabetes mellitus without complications: Secondary | ICD-10-CM | POA: Diagnosis not present

## 2014-08-19 NOTE — Progress Notes (Signed)
Patient was seen on 08/18/14 for the third of a series of three diabetes self-management courses at the [facility]. The following learning objectives were met by the patient during this class:  . State the amount of activity recommended for healthy living . Describe activities suitable for individual needs . Identify ways to regularly incorporate activity into daily life . Identify barriers to activity and ways to over come these barriers  Identify diabetes medications being personally used and their primary action for lowering glucose and possible side effects . Describe role of stress on blood glucose and develop strategies to address psychosocial issues . Identify diabetes complications and ways to prevent them  Explain how to manage diabetes during illness . Evaluate success in meeting personal goal . Establish 2-3 goals that they will plan to diligently work on until they return for the  41-month follow-up visit  Goals:   I will count my carb choices at most meals and snacks  I will be active 60 minutes or more 3 times a week  I will eat less unhealthy fats by eating less sweets  To help manage stress I will  Go on outings with friends at least 2 times a week  Your patient has identified these potential barriers to change:  None   Your patient has identified their diabetes self-care support plan as  On-line Resources Book with recipes and carb counting Plan:  Attend Core 4 in 4 months

## 2014-09-22 ENCOUNTER — Encounter: Payer: Self-pay | Admitting: Gastroenterology

## 2014-09-22 ENCOUNTER — Ambulatory Visit (INDEPENDENT_AMBULATORY_CARE_PROVIDER_SITE_OTHER): Payer: Medicare Other | Admitting: Gastroenterology

## 2014-09-22 VITALS — BP 120/70 | HR 72 | Ht 65.75 in | Wt 180.4 lb

## 2014-09-22 DIAGNOSIS — K529 Noninfective gastroenteritis and colitis, unspecified: Secondary | ICD-10-CM

## 2014-09-22 NOTE — Patient Instructions (Addendum)
We will get records sent from your previous gastroenterologist (Dr. Redmond School at [facility]) for review.  This will include any endoscopic (colonoscopies, most recent 2 colonoscopies) procedures and any associated pathology reports.   After reviewing those records, will determine timing of your next colonoscopy. Please start taking citrucel (orange flavored) powder fiber supplement.  This may cause some bloating at first but that usually goes away. Begin with a small spoonful and work your way up to a large, heaping spoonful daily over a week.

## 2014-09-22 NOTE — Progress Notes (Signed)
HPI: This is a  very pleasant 69 year old woman  who was referred to me by Valerie Klein, MD  to evaluate  chronic loose stools, question need for surveillance colonoscopy    Chief complaint is bowel frequency.  Has urgency to move her bowels, started prior to her panc surgery. Normal consistency, non-bloody.  Has 3-5 BMs daily.  Rarely while sleeping.  Has never tried imodium or fiber supplements.  She says she is 'pre-diabetic'  And has cut out carbs, sugars; this is helping.  No colon cancer in her family.  Had colonoscopy with Dr. Redmond Lawson many years ago, no polyps.  5 years prior she had 'many polyps'  Was having frequency with bowels even prior to colonoscopy with Dr. Redmond Lawson many years ago.  Overall her weight is a bit lower since diet change.      Review of systems: Pertinent positive and negative review of systems were noted in the above HPI section. Complete review of systems was performed and was otherwise normal.   Past Medical History  Diagnosis Date  . Hyperlipidemia   . Hypercholesterolemia   . Pharyngitis, acute   . Pancreatic lesion     found on MRI  . Rectal bleeding   . Hemorrhoids   . Blood in urine   . Contact lens/glasses fitting   . Menopause   . Hormone replacement therapy (postmenopausal)     discontinued ten years ago  . Pancreatic mass     states mother died from pancreatic cancer  . Hypertension   . PONV (postoperative nausea and vomiting)     with all surgeries  . Migraines   . Diabetes mellitus without complication     Past Surgical History  Procedure Laterality Date  . Ovarian cyst removal  2002 - approximate  . Colonoscopy    . Cesarean section  1977, 1979    x2  . Eus  06/15/2011    Procedure: UPPER ENDOSCOPIC ULTRASOUND (EUS) LINEAR;  Surgeon: Valerie Loffler, MD;  Location: [facility];  Service: Endoscopy;  Laterality: N/A;  radial linear  . Tonsillectomy  1952  . Cholecystectomy  08/03/2011    Procedure: LAPAROSCOPIC  CHOLECYSTECTOMY;  Surgeon: Valerie Klein, MD;  Location: [facility];  Service: General;  Laterality: N/A;  . Pancreatectomy  08/03/2011    Procedure: LAPAROSCOPIC PANCREATECTOMY;  Surgeon: Valerie Klein, MD;  Location: [facility];  Service: General;  Laterality: N/A;  distal pancreatectomy  . Laparoscopic splenectomy  08/03/2011    Procedure: LAPAROSCOPIC SPLENECTOMY;  Surgeon: Valerie Klein, MD;  Location: [facility];  Service: General;  Laterality: N/A;    Current Outpatient Prescriptions  Medication Sig Dispense Refill  . acetaminophen (TYLENOL) 500 MG tablet Take 500 mg by mouth every 6 (six) hours as needed.    . cholestyramine (QUESTRAN) 4 GM/DOSE powder Take 0.5 packets (2 g total) by mouth 2 (two) times daily. 378 g 12  . hydrochlorothiazide (HYDRODIURIL) 25 MG tablet Take 25 mg by mouth daily after breakfast.     . lisinopril (PRINIVIL,ZESTRIL) 40 MG tablet Take 40 mg by mouth daily after breakfast.     . Pancrelipase, Lip-Prot-Amyl, (CREON) 24000 UNITS CPEP Take 3 tabs with meals and 1 with snacks. 180 capsule 1  . simvastatin (ZOCOR) 40 MG tablet Take 40 mg by mouth at bedtime.     . Soft Lens Products (OPTI-FREE REWETTING DROPS) SOLN Place 1 drop into both eyes 2 (two) times daily as needed. Dry eyes     No current facility-administered medications for  this visit.    Allergies as of 09/22/2014 - Review Complete 09/22/2014  Allergen Reaction Noted  . Codeine Nausea And Vomiting 04/25/2011  . Sulfa antibiotics Hives 04/25/2011  . Penicillins Other (See Comments) 04/25/2011  . Ciprofloxacin Rash 06/15/2011    Family History  Problem Relation Age of Onset  . Cancer Mother     pancreatic  . Other Father     respiration problems, bleeding ulcers, cardiac  . Anesthesia problems Neg Hx   . Hypotension Neg Hx   . Malignant hyperthermia Neg Hx   . Pseudochol deficiency Neg Hx   . Colon polyps Father   . Colon cancer Neg Hx   . Kidney disease Neg Hx   . Diabetes Neg Hx   . Gallbladder  disease Neg Hx   . Esophageal cancer Neg Hx   . Heart disease Neg Hx     History   Social History  . Marital Status: Widowed    Spouse Name: N/A  . Number of Children: 2  . Years of Education: N/A   Occupational History  . Retired Licensed conveyancer    Social History Main Topics  . Smoking status: Former Smoker -- 0.25 packs/day for 2 years    Quit date: 06/05/1968  . Smokeless tobacco: Never Used  . Alcohol Use: Yes     Comment: rarely  . Drug Use: No  . Sexual Activity: No   Other Topics Concern  . Not on file   Social History Narrative     Physical Exam: BP 120/70 mmHg  Pulse 72  Ht 5' 5.75" (1.67 m)  Wt 180 lb 6 oz (81.818 kg)  BMI 29.34 kg/m2 Constitutional: generally well-appearing Psychiatric: alert and oriented x3 Eyes: extraocular movements intact Mouth: oral pharynx moist, no lesions Neck: supple no lymphadenopathy Cardiovascular: heart regular rate and rhythm Lungs: clear to auscultation bilaterally Abdomen: soft, nontender, nondistended, no obvious ascites, no peritoneal signs, normal bowel sounds Extremities: no lower extremity edema bilaterally Skin: no lesions on visible extremities   Assessment and plan: 69 y.o. female with  chronically frequent stools, personal history of colon polyps  She really doesn't have diarrhea, the consistency of her stools was only solid. I recommended that she try fiber supplement once on a daily basis to see if that will bulk her stools together a bit. This is been a problem for many many years for her and I do not think it requires endoscopic evaluation. She has had colonoscopies in the past and may be due for surveillance colonoscopy. That will depend on the pathology of her index polyps. We will get those records sent over from her previous gastroenterologist and I will let her know timing of her next screening, surveillance colonoscopy.   Valerie Loffler, MD [facility] 09/22/2014, 10:43 AM  Cc: Valerie Klein, MD

## 2014-10-09 ENCOUNTER — Telehealth: Payer: Self-pay | Admitting: Gastroenterology

## 2014-10-09 NOTE — Telephone Encounter (Signed)
Received records from [facility] Internal medicine, Endocrinology Gastroenterology sent to Dr. Owens Loffler 10/09/14 fbg.

## 2014-10-16 ENCOUNTER — Telehealth: Payer: Self-pay | Admitting: Gastroenterology

## 2014-10-16 ENCOUNTER — Encounter: Payer: Self-pay | Admitting: Gastroenterology

## 2014-10-16 NOTE — Telephone Encounter (Signed)
Left message on machine to call back on home number  Left message on machine to call back on cell

## 2014-10-16 NOTE — Telephone Encounter (Signed)
Colonoscopy Dr. Redmond School June 2004; done for "baseline study."  Normal except 5 mm sigmoid polyp which was not precancerous on pathology. Colonoscopy Dr. Redmond School 11/2008 done for "personal history of colon polyps." Diverticulosis noted.  No polyps.  He recommended repeat colonoscopy in 5 years.  Please call her.  Current guidelines show that she does not need repeat screening until 11/2018. Please put her in recall for that. She has never had pre-cancerous polyps removed.

## 2014-10-16 NOTE — Telephone Encounter (Signed)
Pt aware and recall has been put into EPIC

## 2014-11-25 ENCOUNTER — Ambulatory Visit: Payer: PRIVATE HEALTH INSURANCE

## 2016-02-17 DIAGNOSIS — H6123 Impacted cerumen, bilateral: Secondary | ICD-10-CM

## 2016-02-17 HISTORY — DX: Impacted cerumen, bilateral: H61.23

## 2016-08-02 ENCOUNTER — Other Ambulatory Visit (HOSPITAL_COMMUNITY)
Admission: RE | Admit: 2016-08-02 | Discharge: 2016-08-02 | Disposition: A | Discharge status: Home / Self Care | Payer: Medicare Other | Source: Ambulatory Visit | Attending: Family Medicine | Admitting: Family Medicine

## 2016-08-02 ENCOUNTER — Other Ambulatory Visit: Payer: Self-pay | Admitting: Family Medicine

## 2016-08-02 DIAGNOSIS — Z124 Encounter for screening for malignant neoplasm of cervix: Secondary | ICD-10-CM | POA: Diagnosis present

## 2016-08-04 LAB — CYTOLOGY - PAP: DIAGNOSIS: NEGATIVE

## 2016-10-04 ENCOUNTER — Other Ambulatory Visit: Payer: Self-pay | Admitting: General Surgery

## 2016-10-04 DIAGNOSIS — R1012 Left upper quadrant pain: Secondary | ICD-10-CM

## 2016-10-17 ENCOUNTER — Ambulatory Visit
Admission: RE | Admit: 2016-10-17 | Discharge: 2016-10-17 | Disposition: A | Discharge status: Home / Self Care | Payer: Medicare Other | Source: Ambulatory Visit | Attending: General Surgery | Admitting: General Surgery

## 2016-10-17 DIAGNOSIS — R1012 Left upper quadrant pain: Secondary | ICD-10-CM

## 2016-10-17 MED ORDER — GADOBENATE DIMEGLUMINE 529 MG/ML IV SOLN
17.0000 mL | Freq: Once | INTRAVENOUS | Status: AC | PRN
  Administered 2016-10-17: 17 mL via INTRAVENOUS

## 2016-10-18 NOTE — Progress Notes (Signed)
MR looks good.  No evidence of new pancreatic mass or abnormal changes from prior surgery.

## 2017-08-08 ENCOUNTER — Telehealth: Payer: Self-pay

## 2017-08-08 NOTE — Telephone Encounter (Signed)
SENT REFERRAL TO SCHEDULING FROM DR [facility]NE[facility] # [phone]

## 2017-08-20 DIAGNOSIS — K8689 Other specified diseases of pancreas: Secondary | ICD-10-CM

## 2017-08-20 DIAGNOSIS — E119 Type 2 diabetes mellitus without complications: Secondary | ICD-10-CM

## 2017-08-20 DIAGNOSIS — I10 Essential (primary) hypertension: Secondary | ICD-10-CM

## 2017-08-20 DIAGNOSIS — E785 Hyperlipidemia, unspecified: Secondary | ICD-10-CM | POA: Insufficient documentation

## 2017-08-20 HISTORY — DX: Essential (primary) hypertension: I10

## 2017-08-20 HISTORY — DX: Type 2 diabetes mellitus without complications: E11.9

## 2017-08-20 HISTORY — DX: Other specified diseases of pancreas: K86.89

## 2017-08-29 DIAGNOSIS — Z8249 Family history of ischemic heart disease and other diseases of the circulatory system: Secondary | ICD-10-CM | POA: Insufficient documentation

## 2017-08-29 NOTE — Progress Notes (Signed)
Cardiology Office Note:    Date:  08/30/2017   ID:  Valerie Lawson, DOB Dec 11, 1945, MRN [id number]  PCP:  Cari Caraway, MD  Cardiologist:  Shirlee More, MD   Referring MD: Cari Caraway, MD  ASSESSMENT:    1. Family history of hypertrophic cardiomyopathy   2. Essential hypertension   3. Hyperlipidemia, unspecified hyperlipidemia type    PLAN:    In order of problems listed above:  1. On the basis of examination history and EKG I doubt she has hypertrophic cardiomyopathy however an echocardiogram is been ordered.  If normal I will leave further follow-up and as needed basis if abnormal I will bring her back to my office.  I asked to discuss with her son whether he has had genetic testing. 2. Stable continue current treatment 3. Stable continue her statin  Next appointment as needed if echocardiogram does not show cardiomyopathy   Medication Adjustments/Labs and Tests Ordered: Current medicines are reviewed at length with the patient today.  Concerns regarding medicines are outlined above.  Orders Placed This Encounter  Procedures  . EKG 12-Lead  . ECHOCARDIOGRAM COMPLETE   No orders of the defined types were placed in this encounter.    Chief Complaint  Patient presents with  . New Patient (Initial Visit)    per Cari Caraway to get established with cardiologist    History of Present Illness:    Valerie Lawson is a 72 y.o. female who is being seen today for the evaluation of a family history of hypertrophic cardiomyopathy at the request of Cari Caraway, MD. She seeks attention with her son having a disorder hypertrophic cardiomyopathy and she has a handwritten note saying asymmetric septal hypertrophy implying and obstructive variance.  She is unsure if he has had symptoms but he has had a cardiac MRI and does not have an ICD.  She has another child he has not been screened he refused to she has a grand son from her son with hypertrophic cardiomyopathy but is unaware  if he has been screened her husband the father of the patient with cardiomyopathy died suddenly at age 57 was no autopsy.  She has had no exertional chest pain dyspnea palpitation or syncope.  She has no history of congenital rheumatic heart disease.  She has had well-controlled hypertension since she had toxemia of pregnancy.  She is unaware of her son had genetic counseling and/or testing.  Past Medical History:  Diagnosis Date  . Bilateral impacted cerumen 02/17/2016  . Blood in urine   . Cholelithiasis 07/03/2011  . Contact lens/glasses fitting   . Diabetes mellitus ([place]) 08/20/2017  . Diabetes mellitus without complication ([place])   . Diarrhea 06/14/2012  . Essential hypertension 08/20/2017  . Glucose intolerance (impaired glucose tolerance) 08/22/2011  . Hemorrhoids   . Hormone replacement therapy (postmenopausal)    discontinued ten years ago  . Hypercholesterolemia   . Hyperlipidemia   . Hypertension   . Mass of pancreas 08/20/2017  . Menopause   . Migraines   . Mucinous 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/2011  . Pancreatic insufficiency 08/22/2011  . Pancreatic lesion    found on MRI  . Pancreatic mass    states mother died from pancreatic cancer  . Pharyngitis, acute   . PONV (postoperative nausea and vomiting)    with all surgeries  . Rectal bleeding     Past Surgical History:  Procedure Laterality Date  . [place]   x2  . CHOLECYSTECTOMY  08/03/2011   Procedure: LAPAROSCOPIC CHOLECYSTECTOMY;  Surgeon: Stark Klein, MD;  Location: [facility];  Service: General;  Laterality: N/A;  . COLONOSCOPY    . EUS  06/15/2011   Procedure: UPPER ENDOSCOPIC ULTRASOUND (EUS) LINEAR;  Surgeon: Owens Loffler, MD;  Location: [facility];  Service: Endoscopy;  Laterality: N/A;  radial linear  . LAPAROSCOPIC SPLENECTOMY  08/03/2011   Procedure: LAPAROSCOPIC SPLENECTOMY;  Surgeon: Stark Klein, MD;  Location: [facility];  Service: General;  Laterality: N/A;  . OVARIAN CYST REMOVAL  2002  - approximate  . PANCREATECTOMY  08/03/2011   Procedure: LAPAROSCOPIC PANCREATECTOMY;  Surgeon: Stark Klein, MD;  Location: [facility];  Service: General;  Laterality: N/A;  distal pancreatectomy  . SPLENECTOMY    . TONSILLECTOMY  1952    Current Medications: Current Meds  Medication Sig  . acetaminophen (TYLENOL) 500 MG tablet Take 500 mg by mouth every 6 (six) hours as needed.  . Cholecalciferol (VITAMIN D3) 2000 units TABS Take 1 tablet by mouth daily.  . cholestyramine (QUESTRAN) 4 GM/DOSE powder Take 0.5 packets (2 g total) by mouth 2 (two) times daily. (Patient taking differently: Take 2 g by mouth every other day. )  . hydrochlorothiazide (HYDRODIURIL) 25 MG tablet Take 25 mg by mouth daily after breakfast.   . lisinopril (PRINIVIL,ZESTRIL) 40 MG tablet Take 40 mg by mouth daily after breakfast.   . metFORMIN (GLUCOPHAGE-XR) 500 MG 24 hr tablet Take 500 mg by mouth daily.  . Pancrelipase, Lip-Prot-Amyl, (CREON) 24000 UNITS CPEP Take 3 tabs with meals and 1 with snacks. (Patient taking differently: Take 2 caps with meals)  . simvastatin (ZOCOR) 40 MG tablet Take 40 mg by mouth at bedtime.   . Soft Lens Products (OPTI-FREE REWETTING DROPS) SOLN Place 1 drop into both eyes 2 (two) times daily as needed. Dry eyes     Allergies:   Codeine; Sulfa antibiotics; Penicillins; and Ciprofloxacin   Social History   Socioeconomic History  . Marital status: Widowed    Spouse name: Not on file  . Number of children: 2  . Years of education: Not on file  . Highest education level: Not on file  Occupational History  . Occupation: Retired Associate Professor  . Financial resource strain: Not on file  . Food insecurity:    Worry: Not on file    Inability: Not on file  . Transportation needs:    Medical: Not on file    Non-medical: Not on file  Tobacco Use  . Smoking status: Former Smoker    Packs/day: 0.25    Years: 2.00    Pack years: 0.50    Last attempt to quit: 06/05/1968    Years  since quitting: 49.2  . Smokeless tobacco: Never Used  Substance and Sexual Activity  . Alcohol use: Yes    Comment: rarely  . Drug use: No  . Sexual activity: Never    Birth control/protection: Post-menopausal  Lifestyle  . Physical activity:    Days per week: Not on file    Minutes per session: Not on file  . Stress: Not on file  Relationships  . Social connections:    Talks on phone: Not on file    Gets together: Not on file    Attends religious service: Not on file    Active member of club or organization: Not on file    Attends meetings of clubs or organizations: Not on file    Relationship status: Not on file  Other Topics  Concern  . Not on file  Social History Narrative  . Not on file     Family History: The patient's family history includes Cancer in her mother; Celiac disease in her son; Colon polyps in her father; Heart Problems in her son; Hypertension in her son; Hypertrophic cardiomyopathy in her son; Other in her father. There is no history of Anesthesia problems, Hypotension, Malignant hyperthermia, Pseudochol deficiency, Colon cancer, Kidney disease, Diabetes, Gallbladder disease, Esophageal cancer, or Heart disease.  ROS:   Review of Systems  Constitution: Negative.  HENT: Negative.   Eyes: Negative.   Cardiovascular: Positive for irregular heartbeat.  Respiratory: Positive for shortness of breath.   Endocrine: Negative.   Hematologic/Lymphatic: Negative.   Skin: Negative.   Musculoskeletal: Positive for back pain.  Gastrointestinal: Negative.   Genitourinary: Negative.   Neurological: Positive for dizziness.  Psychiatric/Behavioral: Negative.   Allergic/Immunologic: Negative.    Please see the history of present illness.     All other systems reviewed and are negative.  EKGs/Labs/Other Studies Reviewed:    The following studies were reviewed today:   EKG:  EKG is  ordered today.  The ekg ordered today demonstrates [place] first degree AVB OW  normal  Recent Labs:   CMPnormal No results found for requested labs within last 8760 hours.  Recent Lipid Panel Chol 166 HDL 53 LDL 88 No results found for: CHOL, TRIG, HDL, CHOLHDL, VLDL, LDLCALC, LDLDIRECT  Physical Exam:    VS:  BP 106/78 (BP Location: Left Arm, Patient Position: Sitting, Cuff Size: Normal)   Pulse 74   Ht 5\' 7"  (1.702 m)   Wt 172 lb 12.8 oz (78.4 kg)   SpO2 98%   BMI 27.06 kg/m     Wt Readings from Last 3 Encounters:  08/30/17 172 lb 12.8 oz (78.4 kg)  09/22/14 180 lb 6 oz (81.8 kg)  08/05/14 186 lb 9.6 oz (84.6 kg)     GEN:  Well nourished, well developed in no acute distress HEENT: Normal NECK: No JVD; No carotid bruits LYMPHATICS: No lymphadenopathy CARDIAC: RRR, no murmurs, rubs, gallops RESPIRATORY:  Clear to auscultation without rales, wheezing or rhonchi  ABDOMEN: Soft, non-tender, non-distended MUSCULOSKELETAL:  No edema; No deformity  SKIN: Warm and dry NEUROLOGIC:  Alert and oriented x 3 PSYCHIATRIC:  Normal affect     Signed, Shirlee More, MD  08/30/2017 12:04 PM    [place]

## 2017-08-30 ENCOUNTER — Ambulatory Visit: Payer: Medicare Other | Admitting: Cardiology

## 2017-08-30 ENCOUNTER — Encounter: Payer: Self-pay | Admitting: Cardiology

## 2017-08-30 VITALS — BP 106/78 | HR 74 | Ht 67.0 in | Wt 172.8 lb

## 2017-08-30 DIAGNOSIS — E785 Hyperlipidemia, unspecified: Secondary | ICD-10-CM

## 2017-08-30 DIAGNOSIS — I10 Essential (primary) hypertension: Secondary | ICD-10-CM | POA: Diagnosis not present

## 2017-08-30 DIAGNOSIS — Z8249 Family history of ischemic heart disease and other diseases of the circulatory system: Secondary | ICD-10-CM | POA: Diagnosis not present

## 2017-08-30 NOTE — Patient Instructions (Signed)
Medication Instructions:  Your physician recommends that you continue on your current medications as directed. Please refer to the Current Medication list given to you today.  Labwork: None  Testing/Procedures: You had an EKG today.  Your physician has requested that you have an echocardiogram. Echocardiography is a painless test that uses sound waves to create images of your heart. It provides your doctor with information about the size and shape of your heart and how well your heart's chambers and valves are working. This procedure takes approximately one hour. There are no restrictions for this procedure.  Follow-Up: Your physician recommends that you schedule a follow-up appointment as needed if symptoms worsen or fail to improve.  Any Other Special Instructions Will Be Listed Below (If Applicable).     If you need a refill on your cardiac medications before your next appointment, please call your pharmacy.

## 2017-09-26 ENCOUNTER — Ambulatory Visit (HOSPITAL_BASED_OUTPATIENT_CLINIC_OR_DEPARTMENT_OTHER)
Admission: RE | Admit: 2017-09-26 | Discharge: 2017-09-26 | Disposition: A | Discharge status: Home / Self Care | Payer: Medicare Other | Source: Ambulatory Visit | Attending: Cardiology | Admitting: Cardiology

## 2017-09-26 DIAGNOSIS — I10 Essential (primary) hypertension: Secondary | ICD-10-CM | POA: Diagnosis not present

## 2017-09-26 DIAGNOSIS — E785 Hyperlipidemia, unspecified: Secondary | ICD-10-CM | POA: Diagnosis not present

## 2017-09-26 DIAGNOSIS — Z8249 Family history of ischemic heart disease and other diseases of the circulatory system: Secondary | ICD-10-CM | POA: Insufficient documentation

## 2017-09-26 DIAGNOSIS — E119 Type 2 diabetes mellitus without complications: Secondary | ICD-10-CM | POA: Diagnosis not present

## 2017-09-26 DIAGNOSIS — I429 Cardiomyopathy, unspecified: Secondary | ICD-10-CM | POA: Insufficient documentation

## 2017-09-26 NOTE — Progress Notes (Signed)
Echocardiogram 2D Echocardiogram has been performed.  Valerie Lawson 09/26/2017, 2:54 PM

## 2017-09-27 ENCOUNTER — Telehealth: Payer: Self-pay

## 2017-09-27 ENCOUNTER — Encounter: Payer: Self-pay | Admitting: Cardiology

## 2017-09-27 ENCOUNTER — Other Ambulatory Visit (HOSPITAL_BASED_OUTPATIENT_CLINIC_OR_DEPARTMENT_OTHER): Payer: Medicare Other

## 2017-09-27 NOTE — Telephone Encounter (Signed)
Patient informed of results.  

## 2017-09-27 NOTE — Telephone Encounter (Signed)
Left message to return call on home and mobile number for echocardiogram results.

## 2017-09-27 NOTE — Telephone Encounter (Signed)
-----   Message from Richardo Priest, MD sent at 09/27/2017  7:58 AM EDT ----- Echo is good, no findings of cardiomyopathy with her family history

## 2018-01-24 ENCOUNTER — Ambulatory Visit: Payer: Medicare Other | Attending: Student | Admitting: Physical Therapy

## 2018-01-24 ENCOUNTER — Encounter: Payer: Self-pay | Admitting: Physical Therapy

## 2018-01-24 ENCOUNTER — Other Ambulatory Visit: Payer: Self-pay

## 2018-01-24 DIAGNOSIS — M5441 Lumbago with sciatica, right side: Secondary | ICD-10-CM | POA: Insufficient documentation

## 2018-01-24 DIAGNOSIS — M62830 Muscle spasm of back: Secondary | ICD-10-CM

## 2018-01-24 NOTE — Therapy (Signed)
[address] [place], [place], [address] Phone: [phone]   Fax:  [phone]  Physical Therapy Evaluation  Patient Details  Name: Valerie Lawson MRN: [id number] Date of Birth: [birth year] Referring Provider: Wynelle Link   Encounter Date: 01/24/2018  PT End of Session - 01/24/18 1558    Visit Number  1    Date for PT Re-Evaluation  03/26/18    PT Start Time  7253    PT Stop Time  1610    PT Time Calculation (min)  55 min    Activity Tolerance  Patient tolerated treatment well    Behavior During Therapy  [facility] for tasks assessed/performed       Past Medical History:  Diagnosis Date  . Bilateral impacted cerumen 02/17/2016  . Blood in urine   . Cholelithiasis 07/03/2011  . Contact lens/glasses fitting   . Diabetes mellitus ([place]) 08/20/2017  . Diabetes mellitus without complication ([place])   . Diarrhea 06/14/2012  . Essential hypertension 08/20/2017  . Glucose intolerance (impaired glucose tolerance) 08/22/2011  . Hemorrhoids   . Hormone replacement therapy (postmenopausal)    discontinued ten years ago  . Hypercholesterolemia   . Hyperlipidemia   . Hypertension   . Mass of pancreas 08/20/2017  . Menopause   . Migraines   . Mucinous 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/2011  . Pancreatic insufficiency 08/22/2011  . Pancreatic lesion    found on MRI  . Pancreatic mass    states mother died from pancreatic cancer  . Pharyngitis, acute   . PONV (postoperative nausea and vomiting)    with all surgeries  . Rectal bleeding     Past Surgical History:  Procedure Laterality Date  . [place]   x2  . CHOLECYSTECTOMY  08/03/2011   Procedure: LAPAROSCOPIC CHOLECYSTECTOMY;  Surgeon: Stark Klein, MD;  [facility];  Service: General;  Laterality: N/A;  . COLONOSCOPY    . EUS  06/15/2011   Procedure: UPPER ENDOSCOPIC ULTRASOUND (EUS) LINEAR;  Surgeon: Owens Loffler, MD;  [facility];   Service: Endoscopy;  Laterality: N/A;  radial linear  . LAPAROSCOPIC SPLENECTOMY  08/03/2011   Procedure: LAPAROSCOPIC SPLENECTOMY;  Surgeon: Stark Klein, MD;  [facility];  Service: General;  Laterality: N/A;  . OVARIAN CYST REMOVAL  2002 - approximate  . PANCREATECTOMY  08/03/2011   Procedure: LAPAROSCOPIC PANCREATECTOMY;  Surgeon: Stark Klein, MD;  [facility];  Service: General;  Laterality: N/A;  distal pancreatectomy  . SPLENECTOMY    . TONSILLECTOMY  1952    There were no vitals filed for this visit.   Subjective Assessment - 01/24/18 1519    Subjective  Patient reports some LBP for a number of years.  REports about a year ago stayed at the beach and had to go up and down stairs a lot with a granddaughter, caused severe pain.  She reports that over time the pain subsided some.  This summer had the same situation happen again.  X-rays show DDD.    Limitations  Standing;Walking;House hold activities    Patient Stated Goals  have less pain, be able to lift grandchild and go up and down stairs    Currently in Pain?  Yes    Pain Score  3     Pain Location  Back    Pain Orientation  Lower;Right    Pain Descriptors / Indicators  Aching;Spasm    Pain Type  Acute pain  Pain Radiating Towards  has had some right lower leg pain    Pain Onset  More than a month ago    Pain Frequency  Constant    Aggravating Factors   walking on beach, stairs and lifting pain up to 9/10    Pain Relieving Factors  heat and ice, OTC pain meds help some at best 2/10    Effect of Pain on Daily Activities  limits ADL's vacations         [facility] PT Assessment - 01/24/18 0001      Assessment   Medical Diagnosis  LBP with right radiculopathy    Referring Provider  Aluisio    Onset Date/Surgical Date  12/24/17    Prior Therapy  no      Precautions   Precautions  None      Balance Screen   Has the patient fallen in the past 6 months  No    Has the patient had a decrease in activity level  because of a fear of falling?   No    Is the patient reluctant to leave their home because of a fear of falling?   No      Home Environment   Additional Comments  does housework, some yardowrk and gardening      Prior Function   Level of Independence  Independent    Vocation  Retired    Leisure  swim and water exercise 3x/week      Posture/Postural Control   Posture Comments  fwd head, rounded shoulders      ROM / Strength   AROM / PROM / Strength  AROM;Strength      AROM   Overall AROM Comments  Lumbar ROM WNL's for flexion other motions decreased 50% with pain in the right low back      Strength   Overall Strength Comments  WFL's      Flexibility   Soft Tissue Assessment /Muscle Length  yes    Hamstrings  mild    Piriformis  mild      Palpation   SI assessment   tight lumbar parapsinals, tight IRB, had tenderness in the right SI area                Objective measurements completed on examination: See above findings.      [place] Adult PT Treatment/Exercise - 01/24/18 0001      Modalities   Modalities  Moist Heat;Electrical Stimulation      Moist Heat Therapy   Number Minutes Moist Heat  15 Minutes    Moist Heat Location  Lumbar Spine      Electrical Stimulation   Electrical Stimulation Location  right SI area    Electrical Stimulation Action  IFC    Electrical Stimulation Parameters  supine    Electrical Stimulation Goals  Pain             PT Education - 01/24/18 1558    Education Details  Wms flexion    Person(s) Educated  Patient    Methods  Explanation;Demonstration;Handout    Comprehension  Verbalized understanding       PT Short Term Goals - 01/24/18 1607      PT SHORT TERM GOAL #1   Title  independent with initial HEP    Time  3    Period  Weeks    Status  New        PT Long Term Goals - 01/24/18 1607  PT LONG TERM GOAL #1   Title  understand proper posture and body mechanics    Time  8    Period  Weeks    Status   New      PT LONG TERM GOAL #2   Title  decrease pain 50%    Time  8    Period  Weeks    Status  New      PT LONG TERM GOAL #3   Title  increase lumbar ROM 25%    Time  8    Period  Weeks    Status  New      PT LONG TERM GOAL #4   Title  be able to go up and down stairs without increase of pain    Time  8    Period  Weeks    Status  New             Plan - 01/24/18 1558    Clinical Impression Statement  Patient with right low back pain for over a year, reports started with stairs and lifting grand child.  X-rays show DDD.  She has limitations in extension and side bending that cause right SI pain.  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    Clinical Presentation  Stable    Clinical Decision Making  Low    Rehab Potential  Good    PT Frequency  2x / week    PT Duration  8 weeks    PT Treatment/Interventions  ADLs/Self Care Home Management;Cryotherapy;Electrical Stimulation;Moist Heat;Traction;Therapeutic activities;Therapeutic exercise;Manual techniques;Patient/family education    PT Next Visit Plan  start stabilization exercises, she is going to the beach over the next few days    Consulted and Agree with Plan of Care  Patient       Patient will benefit from skilled therapeutic intervention in order to improve the following deficits and impairments:  Decreased range of motion, Increased muscle spasms, Pain, Improper body mechanics, Impaired flexibility, Decreased strength, Postural dysfunction  Visit Diagnosis: Acute right-sided low back pain with right-sided sciatica - Plan: PT plan of care cert/re-cert  Muscle spasm of back - Plan: PT plan of care cert/re-cert     Problem List Patient Active Problem List   Diagnosis Date Noted  . Family history of hypertrophic cardiomyopathy 08/29/2017  . Diabetes mellitus ([place]) 08/20/2017  . Essential hypertension 08/20/2017  . Hyperlipidemia 08/20/2017  . Mass of pancreas  08/20/2017  . Bilateral impacted cerumen 02/17/2016  . Diarrhea 06/14/2012  . Migraines   . Pancreatic insufficiency 08/22/2011  . Glucose intolerance (impaired glucose tolerance) 08/22/2011  . Cholelithiasis 07/03/2011  . Mucinous 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/2011  . Hemorrhoids 05/23/2011    Sumner Boast., PT 01/24/2018, 4:10 PM  [address] [place], [place], [address] Phone: [phone]   Fax:  [phone]  Name: JUSTYCE YEATER MRN: [id number] Date of Birth: May 13, 1946

## 2018-01-31 ENCOUNTER — Ambulatory Visit: Payer: Medicare Other | Admitting: Physical Therapy

## 2018-01-31 ENCOUNTER — Encounter: Payer: Self-pay | Admitting: Physical Therapy

## 2018-01-31 DIAGNOSIS — M62830 Muscle spasm of back: Secondary | ICD-10-CM

## 2018-01-31 DIAGNOSIS — M5441 Lumbago with sciatica, right side: Secondary | ICD-10-CM

## 2018-01-31 NOTE — Therapy (Signed)
[place] [address] [place], [place], [address] Phone: [phone]   Fax:  [phone]  Physical Therapy Treatment  Patient Details  Name: Valerie Lawson MRN: [id number] Date of Birth: 1946/05/04 Referring Provider: Wynelle Link   Encounter Date: 01/31/2018  PT End of Session - 01/31/18 1533    Visit Number  2    Date for PT Re-Evaluation  03/26/18    PT Start Time  6629    PT Stop Time  1545    PT Time Calculation (min)  54 min       Past Medical History:  Diagnosis Date  . Bilateral impacted cerumen 02/17/2016  . Blood in urine   . Cholelithiasis 07/03/2011  . Contact lens/glasses fitting   . Diabetes mellitus ([place]) 08/20/2017  . Diabetes mellitus without complication ([place])   . Diarrhea 06/14/2012  . Essential hypertension 08/20/2017  . Glucose intolerance (impaired glucose tolerance) 08/22/2011  . Hemorrhoids   . Hormone replacement therapy (postmenopausal)    discontinued ten years ago  . Hypercholesterolemia   . Hyperlipidemia   . Hypertension   . Mass of pancreas 08/20/2017  . Menopause   . Migraines   . Mucinous 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/2011  . Pancreatic insufficiency 08/22/2011  . Pancreatic lesion    found on MRI  . Pancreatic mass    states mother died from pancreatic cancer  . Pharyngitis, acute   . PONV (postoperative nausea and vomiting)    with all surgeries  . Rectal bleeding     Past Surgical History:  Procedure Laterality Date  . [place]   x2  . CHOLECYSTECTOMY  08/03/2011   Procedure: LAPAROSCOPIC CHOLECYSTECTOMY;  Surgeon: Stark Klein, MD;  [facility];  Service: General;  Laterality: N/A;  . COLONOSCOPY    . EUS  06/15/2011   Procedure: UPPER ENDOSCOPIC ULTRASOUND (EUS) LINEAR;  Surgeon: Owens Loffler, MD;  [facility];  Service: Endoscopy;  Laterality: N/A;  radial linear  . LAPAROSCOPIC SPLENECTOMY  08/03/2011   Procedure: LAPAROSCOPIC  SPLENECTOMY;  Surgeon: Stark Klein, MD;  [facility];  Service: General;  Laterality: N/A;  . OVARIAN CYST REMOVAL  2002 - approximate  . PANCREATECTOMY  08/03/2011   Procedure: LAPAROSCOPIC PANCREATECTOMY;  Surgeon: Stark Klein, MD;  [facility];  Service: General;  Laterality: N/A;  distal pancreatectomy  . SPLENECTOMY    . TONSILLECTOMY  1952    There were no vitals filed for this visit.  Subjective Assessment - 01/31/18 1452    Subjective  "stretches are helping, I need to so them more" leg pain comes and goes    Currently in Pain?  Yes    Pain Score  3     Pain Location  Back                       [facility] Adult PT Treatment/Exercise - 01/31/18 0001      Exercises   Exercises  Lumbar;Knee/Hip      Lumbar Exercises: Aerobic   Nustep  L 4 5 min      Lumbar Exercises: Standing   Other Standing Lumbar Exercises  red tband scap stab 3 way 10 each      Lumbar Exercises: Seated   Other Seated Lumbar Exercises  sit fit stab and ROM ex      Lumbar Exercises: Supine   Ab Set  15 reps;3 seconds    Clam  15 reps    Bridge with [employer]  15 reps;2 seconds    Other Supine Lumbar Exercises  ball under feet bridge,obl and KTC 15 each      Knee/Hip Exercises: Standing   Other Standing Knee Exercises  hip 3 way red tband 10 each      Modalities   Modalities  Moist Heat;Electrical Stimulation      Moist Heat Therapy   Number Minutes Moist Heat  15 Minutes    Moist Heat Location  Lumbar Spine      Electrical Stimulation   Electrical Stimulation Location  right SI area    Electrical Stimulation Action  --   IFC   Electrical Stimulation Parameters  supine    Electrical Stimulation Goals  Pain      Manual Therapy   Manual Therapy  Passive ROM    Passive ROM  LE and trunk               PT Short Term Goals - 01/24/18 1607      PT SHORT TERM GOAL #1   Title  independent with initial HEP    Time  3    Period  Weeks    Status  New         PT Long Term Goals - 01/24/18 1607      PT LONG TERM GOAL #1   Title  understand proper posture and body mechanics    Time  8    Period  Weeks    Status  New      PT LONG TERM GOAL #2   Title  decrease pain 50%    Time  8    Period  Weeks    Status  New      PT LONG TERM GOAL #3   Title  increase lumbar ROM 25%    Time  8    Period  Weeks    Status  New      PT LONG TERM GOAL #4   Title  be able to go up and down stairs without increase of pain    Time  8    Period  Weeks    Status  New            Plan - 01/31/18 1534    Clinical Impression Statement  pt tolerated initial progression of ex well with cues for core activation. RT LE tightness esp with rotation.    PT Treatment/Interventions  ADLs/Self Care Home Management;Cryotherapy;Electrical Stimulation;Moist Heat;Traction;Therapeutic activities;Therapeutic exercise;Manual techniques;Patient/family education    PT Next Visit Plan  assess and progress       Patient will benefit from skilled therapeutic intervention in order to improve the following deficits and impairments:  Decreased range of motion, Increased muscle spasms, Pain, Improper body mechanics, Impaired flexibility, Decreased strength, Postural dysfunction  Visit Diagnosis: Acute right-sided low back pain with right-sided sciatica  Muscle spasm of back     Problem List Patient Active Problem List   Diagnosis Date Noted  . Family history of hypertrophic cardiomyopathy 08/29/2017  . Diabetes mellitus ([place]) 08/20/2017  . Essential hypertension 08/20/2017  . Hyperlipidemia 08/20/2017  . Mass of pancreas 08/20/2017  . Bilateral impacted cerumen 02/17/2016  . Diarrhea 06/14/2012  . Migraines   . Pancreatic insufficiency 08/22/2011  . Glucose intolerance (impaired glucose tolerance) 08/22/2011  . Cholelithiasis 07/03/2011  . Mucinous 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/2011  . Hemorrhoids 05/23/2011    PAYSEUR,ANGIE PTA 01/31/2018, 3:36  PM  [facility]  [place] [place] [address], [place], [address] Phone: [phone]   Fax:  [phone]  Name: Valerie Lawson MRN: [id number] Date of Birth: 1945-09-15

## 2018-02-06 ENCOUNTER — Ambulatory Visit: Payer: Medicare Other | Attending: Student | Admitting: Physical Therapy

## 2018-02-06 ENCOUNTER — Encounter: Payer: Self-pay | Admitting: Physical Therapy

## 2018-02-06 DIAGNOSIS — M62830 Muscle spasm of back: Secondary | ICD-10-CM | POA: Diagnosis present

## 2018-02-06 DIAGNOSIS — M5441 Lumbago with sciatica, right side: Secondary | ICD-10-CM | POA: Diagnosis present

## 2018-02-06 NOTE — Therapy (Signed)
[place] [address] [place], [place], [address] Phone: [phone]   Fax:  [phone]  Physical Therapy Treatment  Patient Details  Name: Valerie Lawson MRN: [id number] Date of Birth: November 11, 1945 Referring Provider: Wynelle Link   Encounter Date: 02/06/2018  PT End of Session - 02/06/18 0849    Visit Number  3    Date for PT Re-Evaluation  03/26/18    PT Start Time  0800    PT Stop Time  0902    PT Time Calculation (min)  62 min    Activity Tolerance  Patient tolerated treatment well    Behavior During Therapy  [facility] for tasks assessed/performed       Past Medical History:  Diagnosis Date  . Bilateral impacted cerumen 02/17/2016  . Blood in urine   . Cholelithiasis 07/03/2011  . Contact lens/glasses fitting   . Diabetes mellitus ([place]) 08/20/2017  . Diabetes mellitus without complication ([place])   . Diarrhea 06/14/2012  . Essential hypertension 08/20/2017  . Glucose intolerance (impaired glucose tolerance) 08/22/2011  . Hemorrhoids   . Hormone replacement therapy (postmenopausal)    discontinued ten years ago  . Hypercholesterolemia   . Hyperlipidemia   . Hypertension   . Mass of pancreas 08/20/2017  . Menopause   . Migraines   . Mucinous 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/2011  . Pancreatic insufficiency 08/22/2011  . Pancreatic lesion    found on MRI  . Pancreatic mass    states mother died from pancreatic cancer  . Pharyngitis, acute   . PONV (postoperative nausea and vomiting)    with all surgeries  . Rectal bleeding     Past Surgical History:  Procedure Laterality Date  . [place]   x2  . CHOLECYSTECTOMY  08/03/2011   Procedure: LAPAROSCOPIC CHOLECYSTECTOMY;  Surgeon: Stark Klein, MD;  [facility];  Service: General;  Laterality: N/A;  . COLONOSCOPY    . EUS  06/15/2011   Procedure: UPPER ENDOSCOPIC ULTRASOUND (EUS) LINEAR;  Surgeon: Owens Loffler, MD;  [facility];   Service: Endoscopy;  Laterality: N/A;  radial linear  . LAPAROSCOPIC SPLENECTOMY  08/03/2011   Procedure: LAPAROSCOPIC SPLENECTOMY;  Surgeon: Stark Klein, MD;  [facility];  Service: General;  Laterality: N/A;  . OVARIAN CYST REMOVAL  2002 - approximate  . PANCREATECTOMY  08/03/2011   Procedure: LAPAROSCOPIC PANCREATECTOMY;  Surgeon: Stark Klein, MD;  [facility];  Service: General;  Laterality: N/A;  distal pancreatectomy  . SPLENECTOMY    . TONSILLECTOMY  1952    There were no vitals filed for this visit.  Subjective Assessment - 02/06/18 0800    Subjective  "the exercises at home are helping"    Currently in Pain?  Yes    Pain Score  1     Pain Location  Back    Pain Orientation  Right                       [facility] Adult PT Treatment/Exercise - 02/06/18 0001      Exercises   Exercises  Lumbar;Knee/Hip      Lumbar Exercises: Aerobic   Nustep  L 4 6 min      Lumbar Exercises: Standing   Row  Theraband;20 reps;Both;Strengthening    Theraband Level (Row)  Level 3 (Green)    Shoulder Extension  Theraband;20 reps;Both;Strengthening    Theraband Level (Shoulder Extension)  Level 3 (Green)  Lumbar Exercises: Seated   Sit to Stand  20 reps   2nd set holding red ball    Other Seated Lumbar Exercises  sit fit stab RON, HS curls green tband, LAQ 3lb  2x10      Lumbar Exercises: Supine   Ab Set  15 reps;3 seconds    Bridge  2 seconds;20 reps;Compliant      Knee/Hip Exercises: Standing   Other Standing Knee Exercises  hip 3 way 3lb x10       Modalities   Modalities  Moist Heat;Electrical Stimulation      Moist Heat Therapy   Number Minutes Moist Heat  15 Minutes    Moist Heat Location  Lumbar Spine      Electrical Stimulation   Electrical Stimulation Location  right SI area    Electrical Stimulation Parameters  supine    Electrical Stimulation Goals  Pain      Manual Therapy   Manual Therapy  Passive ROM    Passive ROM  LE and trunk              PT Education - 02/06/18 0850    Education Details  Proper TENs unit application    Person(s) Educated  Patient    Methods  Explanation;Demonstration;Tactile cues    Comprehension  Verbalized understanding       PT Short Term Goals - 01/24/18 1607      PT SHORT TERM GOAL #1   Title  independent with initial HEP    Time  3    Period  Weeks    Status  New        PT Long Term Goals - 01/24/18 1607      PT LONG TERM GOAL #1   Title  understand proper posture and body mechanics    Time  8    Period  Weeks    Status  New      PT LONG TERM GOAL #2   Title  decrease pain 50%    Time  8    Period  Weeks    Status  New      PT LONG TERM GOAL #3   Title  increase lumbar ROM 25%    Time  8    Period  Weeks    Status  New      PT LONG TERM GOAL #4   Title  be able to go up and down stairs without increase of pain    Time  8    Period  Weeks    Status  New            Plan - 02/06/18 3545    Clinical Impression Statement  Pt continues to do well and reports positive response to HEP. Cues for core activation with supine exercises. Good LE flexibility noted with MT.    Rehab Potential  Good    PT Frequency  2x / week    PT Duration  8 weeks    PT Treatment/Interventions  ADLs/Self Care Home Management;Cryotherapy;Electrical Stimulation;Moist Heat;Traction;Therapeutic activities;Therapeutic exercise;Manual techniques;Patient/family education    PT Next Visit Plan  assess and progress       Patient will benefit from skilled therapeutic intervention in order to improve the following deficits and impairments:  Decreased range of motion, Increased muscle spasms, Pain, Improper body mechanics, Impaired flexibility, Decreased strength, Postural dysfunction  Visit Diagnosis: Acute right-sided low back pain with right-sided sciatica  Muscle spasm of back     Problem  List Patient Active Problem List   Diagnosis Date Noted  . Family history of  hypertrophic cardiomyopathy 08/29/2017  . Diabetes mellitus ([place]) 08/20/2017  . Essential hypertension 08/20/2017  . Hyperlipidemia 08/20/2017  . Mass of pancreas 08/20/2017  . Bilateral impacted cerumen 02/17/2016  . Diarrhea 06/14/2012  . Migraines   . Pancreatic insufficiency 08/22/2011  . Glucose intolerance (impaired glucose tolerance) 08/22/2011  . Cholelithiasis 07/03/2011  . Mucinous 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/2011  . Hemorrhoids 05/23/2011    Scot Jun, PTA 02/06/2018, 8:53 AM  [place], [place], [address] Phone: [phone]   Fax:  [phone]  Name: ALOHILANI LEVENHAGEN MRN: [id number] Date of Birth: 02-20-1946

## 2018-02-12 ENCOUNTER — Ambulatory Visit: Payer: Medicare Other | Admitting: Physical Therapy

## 2018-02-12 ENCOUNTER — Encounter: Payer: Self-pay | Admitting: Physical Therapy

## 2018-02-12 DIAGNOSIS — M5441 Lumbago with sciatica, right side: Secondary | ICD-10-CM | POA: Diagnosis not present

## 2018-02-12 DIAGNOSIS — M62830 Muscle spasm of back: Secondary | ICD-10-CM

## 2018-02-12 NOTE — Therapy (Signed)
[place], [place], [address] Phone: [phone]   Fax:  [phone]  Physical Therapy Treatment  Patient Details  Name: Valerie Lawson MRN: [id number] Date of Birth: [birth year] Referring Provider: Wynelle Link   Encounter Date: 02/12/2018  PT End of Session - 02/12/18 1556    Visit Number  4    Date for PT Re-Evaluation  03/26/18    PT Start Time  1510    PT Stop Time  1608    PT Time Calculation (min)  58 min    Activity Tolerance  Patient tolerated treatment well    Behavior During Therapy  [facility] for tasks assessed/performed       Past Medical History:  Diagnosis Date  . Bilateral impacted cerumen 02/17/2016  . Blood in urine   . Cholelithiasis 07/03/2011  . Contact lens/glasses fitting   . Diabetes mellitus ([place]) 08/20/2017  . Diabetes mellitus without complication ([place])   . Diarrhea 06/14/2012  . Essential hypertension 08/20/2017  . Glucose intolerance (impaired glucose tolerance) 08/22/2011  . Hemorrhoids   . Hormone replacement therapy (postmenopausal)    discontinued ten years ago  . Hypercholesterolemia   . Hyperlipidemia   . Hypertension   . Mass of pancreas 08/20/2017  . Menopause   . Migraines   . Mucinous 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/2011  . Pancreatic insufficiency 08/22/2011  . Pancreatic lesion    found on MRI  . Pancreatic mass    states mother died from pancreatic cancer  . Pharyngitis, acute   . PONV (postoperative nausea and vomiting)    with all surgeries  . Rectal bleeding     Past Surgical History:  Procedure Laterality Date  . [place]   x2  . CHOLECYSTECTOMY  08/03/2011   Procedure: LAPAROSCOPIC CHOLECYSTECTOMY;  Surgeon: Stark Klein, MD;  Location: [facility];  Service: General;  Laterality: N/A;  . COLONOSCOPY    . EUS  06/15/2011   Procedure: UPPER ENDOSCOPIC ULTRASOUND (EUS) LINEAR;  Surgeon: Owens Loffler, MD;  [facility]: [facility];   Service: Endoscopy;  Laterality: N/A;  radial linear  . LAPAROSCOPIC SPLENECTOMY  08/03/2011   Procedure: LAPAROSCOPIC SPLENECTOMY;  Surgeon: Stark Klein, MD;  Location: [facility];  Service: General;  Laterality: N/A;  . OVARIAN CYST REMOVAL  2002 - approximate  . PANCREATECTOMY  08/03/2011   Procedure: LAPAROSCOPIC PANCREATECTOMY;  Surgeon: Stark Klein, MD;  [facility];  Service: General;  Laterality: N/A;  distal pancreatectomy  . SPLENECTOMY    . TONSILLECTOMY  1952    There were no vitals filed for this visit.  Subjective Assessment - 02/12/18 1509    Subjective  Pt reports that she is feeling good with no pain     Currently in Pain?  No/denies         [facility] PT Assessment - 02/12/18 0001      AROM   Overall AROM Comments  Lumbar ROM WFL for all motions. Pain reported with R sidebending.                    [facility] Adult PT Treatment/Exercise - 02/12/18 0001      Exercises   Exercises  Lumbar;Knee/Hip      Lumbar Exercises: Aerobic   Nustep  L 4 6 min      Lumbar Exercises: Machines for Strengthening   Cybex Knee Extension  5lb 2x10     Cybex Knee Flexion  20lb  2x10     Other Lumbar Machine Exercise  Rows and lats 20lb 2x10       Lumbar Exercises: Standing   Row  Theraband;20 reps;Both;Strengthening    Theraband Level (Row)  Level 3 (Green)    Shoulder Extension  Theraband;20 reps;Both;Strengthening    Theraband Level (Shoulder Extension)  Level 3 (Green)      Lumbar Exercises: Seated   Sit to Stand  20 reps   holding yellow ball.    Other Seated Lumbar Exercises  OHP yellow ball 2x10       Lumbar Exercises: Supine   Ab Set  15 reps;3 seconds    Bridge  Compliant;15 reps;2 seconds    Other Supine Lumbar Exercises  ball under feet bridge,obl and KTC 15 each      Modalities   Modalities  Moist Heat;Electrical Stimulation      Moist Heat Therapy   Number Minutes Moist Heat  15 Minutes    Moist Heat Location  Lumbar Spine      Electrical  Stimulation   Electrical Stimulation Location  right SI area    Electrical Stimulation Action  IFC    Electrical Stimulation Parameters  supine    Electrical Stimulation Goals  Pain      Manual Therapy   Manual Therapy  Passive ROM    Passive ROM  LE and trunk               PT Short Term Goals - 01/24/18 1607      PT SHORT TERM GOAL #1   Title  independent with initial HEP    Time  3    Period  Weeks    Status  New        PT Long Term Goals - 02/12/18 1544      PT LONG TERM GOAL #1   Title  understand proper posture and body mechanics    Status  Partially Met      PT LONG TERM GOAL #2   Title  decrease pain 50%    Status  Achieved      PT LONG TERM GOAL #3   Title  increase lumbar ROM 25%    Status  Partially Met      PT LONG TERM GOAL #4   Title  be able to go up and down stairs without increase of pain    Status  Partially Met            Plan - 02/12/18 1556    Clinical Impression Statement  Pt is doing well and is progressing towards all goals. She has increased her lumbar ROM but does have some pain with R side bending. All exercises completed well. She did reports some LBP when transferring from sitting to supine.    Rehab Potential  Good    PT Frequency  2x / week    PT Duration  8 weeks    PT Treatment/Interventions  ADLs/Self Care Home Management;Cryotherapy;Electrical Stimulation;Moist Heat;Traction;Therapeutic activities;Therapeutic exercise;Manual techniques;Patient/family education    PT Next Visit Plan  assess and progress       Patient will benefit from skilled therapeutic intervention in order to improve the following deficits and impairments:  Decreased range of motion, Increased muscle spasms, Pain, Improper body mechanics, Impaired flexibility, Decreased strength, Postural dysfunction  Visit Diagnosis: Muscle spasm of back  Acute right-sided low back pain with right-sided sciatica     Problem List Patient Active Problem  List   Diagnosis Date Noted  .  Family history of hypertrophic cardiomyopathy 08/29/2017  . Diabetes mellitus ([place]) 08/20/2017  . Essential hypertension 08/20/2017  . Hyperlipidemia 08/20/2017  . Mass of pancreas 08/20/2017  . Bilateral impacted cerumen 02/17/2016  . Diarrhea 06/14/2012  . Migraines   . Pancreatic insufficiency 08/22/2011  . Glucose intolerance (impaired glucose tolerance) 08/22/2011  . Cholelithiasis 07/03/2011  . Mucinous 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/2011  . Hemorrhoids 05/23/2011    Scot Jun, PTA 02/12/2018, 3:58 PM  [place], [place], [address] Phone: [phone]   Fax:  [phone]  Name: GRACEANN BOILEAU MRN: [id number] Date of Birth: 31-Jul-1945

## 2018-02-14 ENCOUNTER — Ambulatory Visit: Payer: Medicare Other | Admitting: Physical Therapy

## 2018-02-14 DIAGNOSIS — M5441 Lumbago with sciatica, right side: Secondary | ICD-10-CM

## 2018-02-14 DIAGNOSIS — M62830 Muscle spasm of back: Secondary | ICD-10-CM

## 2018-02-14 NOTE — Therapy (Signed)
[place], [place], [address] Phone: [phone]   Fax:  [phone]  Physical Therapy Treatment  Patient Details  Name: Valerie Lawson MRN: [id number] Date of Birth: [birth year] Referring Provider: Wynelle Link   Encounter Date: 02/14/2018  PT End of Session - 02/14/18 1258    Visit Number  5    Date for PT Re-Evaluation  03/26/18    PT Start Time  1230    PT Stop Time  1330    PT Time Calculation (min)  60 min       Past Medical History:  Diagnosis Date  . Bilateral impacted cerumen 02/17/2016  . Blood in urine   . Cholelithiasis 07/03/2011  . Contact lens/glasses fitting   . Diabetes mellitus ([place]) 08/20/2017  . Diabetes mellitus without complication ([place])   . Diarrhea 06/14/2012  . Essential hypertension 08/20/2017  . Glucose intolerance (impaired glucose tolerance) 08/22/2011  . Hemorrhoids   . Hormone replacement therapy (postmenopausal)    discontinued ten years ago  . Hypercholesterolemia   . Hyperlipidemia   . Hypertension   . Mass of pancreas 08/20/2017  . Menopause   . Migraines   . Mucinous 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/2011  . Pancreatic insufficiency 08/22/2011  . Pancreatic lesion    found on MRI  . Pancreatic mass    states mother died from pancreatic cancer  . Pharyngitis, acute   . PONV (postoperative nausea and vomiting)    with all surgeries  . Rectal bleeding     Past Surgical History:  Procedure Laterality Date  . [place]   x2  . CHOLECYSTECTOMY  08/03/2011   Procedure: LAPAROSCOPIC CHOLECYSTECTOMY;  Surgeon: Stark Klein, MD;  [facility];  Service: General;  Laterality: N/A;  . COLONOSCOPY    . EUS  06/15/2011   Procedure: UPPER ENDOSCOPIC ULTRASOUND (EUS) LINEAR;  Surgeon: Owens Loffler, MD;  [facility];  Service: Endoscopy;  Laterality: N/A;  radial linear  . LAPAROSCOPIC SPLENECTOMY  08/03/2011   Procedure: LAPAROSCOPIC  SPLENECTOMY;  Surgeon: Stark Klein, MD;  [facility];  Service: General;  Laterality: N/A;  . OVARIAN CYST REMOVAL  2002 - approximate  . PANCREATECTOMY  08/03/2011   Procedure: LAPAROSCOPIC PANCREATECTOMY;  Surgeon: Stark Klein, MD;  [facility];  Service: General;  Laterality: N/A;  distal pancreatectomy  . SPLENECTOMY    . TONSILLECTOMY  1952    There were no vitals filed for this visit.  Subjective Assessment - 02/14/18 1235    Subjective  first thing in am and STS I feel it but overall 60% better. swam this morning    Currently in Pain?  Yes    Pain Score  1     Pain Location  Back                       [facility] Adult PT Treatment/Exercise - 02/14/18 0001      Exercises   Exercises  Lumbar;Knee/Hip      Lumbar Exercises: Aerobic   Nustep  L 5 7 min      Lumbar Exercises: Machines for Strengthening   Cybex Lumbar Extension  black band 20 times    Cybex Knee Extension  5lb 2x12    Cybex Knee Flexion  20lb 2x15    Other Lumbar Machine Exercise  Rows and lats 20lb 2x15      Lumbar Exercises: Standing   Other  Standing Lumbar Exercises  wt ball OH ext 15, rotation 15 each way      Lumbar Exercises: Supine   Ab Set  15 reps;3 seconds      Knee/Hip Exercises: Standing   Other Standing Knee Exercises  hip pulleys 12 times each leg 4 way      Modalities   Modalities  Moist Heat;Electrical Stimulation      Moist Heat Therapy   Number Minutes Moist Heat  15 Minutes    Moist Heat Location  Lumbar Spine      Electrical Stimulation   Electrical Stimulation Location  right SI area    Electrical Stimulation Action  IFC    Electrical Stimulation Parameters  supine    Electrical Stimulation Goals  Pain               PT Short Term Goals - 02/14/18 1248      PT SHORT TERM GOAL #1   Title  independent with initial HEP    Status  Achieved        PT Long Term Goals - 02/14/18 1248      PT LONG TERM GOAL #1   Title  understand proper  posture and body mechanics    Status  Partially Met      PT LONG TERM GOAL #2   Title  decrease pain 50%    Status  Achieved      PT LONG TERM GOAL #3   Title  increase lumbar ROM 25%    Baseline  WNLS some pain with RT SB    Status  Partially Met      PT LONG TERM GOAL #4   Title  be able to go up and down stairs without increase of pain    Status  Partially Met            Plan - 02/14/18 1259    Clinical Impression Statement  progressing with all goals. AROM WNLS some pain with RT SB. pain decreased 60% and increased func and ex at home including swimming    PT Treatment/Interventions  ADLs/Self Care Home Management;Cryotherapy;Electrical Stimulation;Moist Heat;Traction;Therapeutic activities;Therapeutic exercise;Manual techniques;Patient/family education    PT Next Visit Plan  progress core strength       Patient will benefit from skilled therapeutic intervention in order to improve the following deficits and impairments:  Decreased range of motion, Increased muscle spasms, Pain, Improper body mechanics, Impaired flexibility, Decreased strength, Postural dysfunction  Visit Diagnosis: Muscle spasm of back  Acute right-sided low back pain with right-sided sciatica     Problem List Patient Active Problem List   Diagnosis Date Noted  . Family history of hypertrophic cardiomyopathy 08/29/2017  . Diabetes mellitus ([place]) 08/20/2017  . Essential hypertension 08/20/2017  . Hyperlipidemia 08/20/2017  . Mass of pancreas 08/20/2017  . Bilateral impacted cerumen 02/17/2016  . Diarrhea 06/14/2012  . Migraines   . Pancreatic insufficiency 08/22/2011  . Glucose intolerance (impaired glucose tolerance) 08/22/2011  . Cholelithiasis 07/03/2011  . Mucinous 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/2011  . Hemorrhoids 05/23/2011    PAYSEUR,ANGIE PTA 02/14/2018, 1:03 PM  [address] [place], [place],  [address] Phone: [phone]   Fax:  [phone]  Name: Valerie Lawson MRN: [id number] Date of Birth: [birth year]

## 2018-02-21 ENCOUNTER — Encounter: Payer: Self-pay | Admitting: Physical Therapy

## 2018-02-21 ENCOUNTER — Ambulatory Visit: Payer: Medicare Other | Admitting: Physical Therapy

## 2018-02-21 DIAGNOSIS — M5441 Lumbago with sciatica, right side: Secondary | ICD-10-CM

## 2018-02-21 DIAGNOSIS — M62830 Muscle spasm of back: Secondary | ICD-10-CM

## 2018-02-21 NOTE — Therapy (Signed)
[place], [place], [address] Phone: [phone]   Fax:  [phone]  Physical Therapy Treatment  Patient Details  Name: Valerie Lawson MRN: [id number] Date of Birth: 30-May-1946 Referring Provider: Wynelle Link   Encounter Date: 02/21/2018  PT End of Session - 02/21/18 1056    Visit Number  6    Date for PT Re-Evaluation  03/26/18    PT Start Time  6962    PT Stop Time  1111    PT Time Calculation (min)  56 min    Activity Tolerance  Patient tolerated treatment well    Behavior During Therapy  [facility] for tasks assessed/performed       Past Medical History:  Diagnosis Date  . Bilateral impacted cerumen 02/17/2016  . Blood in urine   . Cholelithiasis 07/03/2011  . Contact lens/glasses fitting   . Diabetes mellitus ([place]) 08/20/2017  . Diabetes mellitus without complication ([place])   . Diarrhea 06/14/2012  . Essential hypertension 08/20/2017  . Glucose intolerance (impaired glucose tolerance) 08/22/2011  . Hemorrhoids   . Hormone replacement therapy (postmenopausal)    discontinued ten years ago  . Hypercholesterolemia   . Hyperlipidemia   . Hypertension   . Mass of pancreas 08/20/2017  . Menopause   . Migraines   . Mucinous 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/2011  . Pancreatic insufficiency 08/22/2011  . Pancreatic lesion    found on MRI  . Pancreatic mass    states mother died from pancreatic cancer  . Pharyngitis, acute   . PONV (postoperative nausea and vomiting)    with all surgeries  . Rectal bleeding     Past Surgical History:  Procedure Laterality Date  . [place]   x2  . CHOLECYSTECTOMY  08/03/2011   Procedure: LAPAROSCOPIC CHOLECYSTECTOMY;  Surgeon: Stark Klein, MD;  [facility];  Service: General;  Laterality: N/A;  . COLONOSCOPY    . EUS  06/15/2011   Procedure: UPPER ENDOSCOPIC ULTRASOUND (EUS) LINEAR;  Surgeon: Owens Loffler, MD;  [facility];   Service: Endoscopy;  Laterality: N/A;  radial linear  . LAPAROSCOPIC SPLENECTOMY  08/03/2011   Procedure: LAPAROSCOPIC SPLENECTOMY;  Surgeon: Stark Klein, MD;  [facility];  Service: General;  Laterality: N/A;  . OVARIAN CYST REMOVAL  2002 - approximate  . PANCREATECTOMY  08/03/2011   Procedure: LAPAROSCOPIC PANCREATECTOMY;  Surgeon: Stark Klein, MD;  [facility];  Service: General;  Laterality: N/A;  distal pancreatectomy  . SPLENECTOMY    . TONSILLECTOMY  1952    There were no vitals filed for this visit.  Subjective Assessment - 02/21/18 1017    Subjective  "I was ok until I picked up a heavy box those morning"    Currently in Pain?  Yes    Pain Score  5     Pain Location  Back    Pain Orientation  Right;Lateral;Lower                       [facility] Adult PT Treatment/Exercise - 02/21/18 0001      Lumbar Exercises: Aerobic   Nustep  L 5 7 min      Lumbar Exercises: Machines for Strengthening   Cybex Lumbar Extension  black band 20 times    Cybex Knee Extension  5lb 2x15    Cybex Knee Flexion  25lb 2x10    Other Lumbar Machine Exercise  Rows and lats  20lb 2x15      Lumbar Exercises: Standing   Row  Theraband;20 reps;Both;Strengthening    Theraband Level (Row)  Level 3 (Green)    Shoulder Extension  Theraband;20 reps;Both;Strengthening    Theraband Level (Shoulder Extension)  Level 3 (Green)    Other Standing Lumbar Exercises  wt ball OH ext 10, rotation 10 each way      Lumbar Exercises: Seated   Sit to Stand  20 reps   OHP with yellow ball      Knee/Hip Exercises: Standing   Other Standing Knee Exercises  hip pulleys 10 times each leg 2 way      Modalities   Modalities  Moist Heat;Electrical Stimulation      Moist Heat Therapy   Number Minutes Moist Heat  15 Minutes    Moist Heat Location  Lumbar Spine      Electrical Stimulation   Electrical Stimulation Location  right SI area    Electrical Stimulation Action  IFC    Electrical  Stimulation Parameters  supine    Electrical Stimulation Goals  Pain      Manual Therapy   Manual Therapy  Passive ROM    Passive ROM  LE and trunk               PT Short Term Goals - 02/14/18 1248      PT SHORT TERM GOAL #1   Title  independent with initial HEP    Status  Achieved        PT Long Term Goals - 02/21/18 1056      PT LONG TERM GOAL #1   Title  understand proper posture and body mechanics    Status  Achieved      PT LONG TERM GOAL #2   Title  decrease pain 50%    Status  Partially Met      PT LONG TERM GOAL #3   Title  increase lumbar ROM 25%    Status  Partially Met      PT LONG TERM GOAL #4   Title  be able to go up and down stairs without increase of pain    Status  Partially Met            Plan - 02/21/18 1057    Clinical Impression Statement  Pt continues to go well and is progressing towards all goals. Reports some pain on the R side of her low back. She stated picking up a heavy box this morning cause her back to hurt. Positive response after MT to the LE and lumbar spine.    Rehab Potential  Good    PT Frequency  2x / week    PT Duration  8 weeks    PT Treatment/Interventions  ADLs/Self Care Home Management;Cryotherapy;Electrical Stimulation;Moist Heat;Traction;Therapeutic activities;Therapeutic exercise;Manual techniques;Patient/family education    PT Next Visit Plan  progress core strength       Patient will benefit from skilled therapeutic intervention in order to improve the following deficits and impairments:  Decreased range of motion, Increased muscle spasms, Pain, Improper body mechanics, Impaired flexibility, Decreased strength, Postural dysfunction  Visit Diagnosis: Acute right-sided low back pain with right-sided sciatica  Muscle spasm of back     Problem List Patient Active Problem List   Diagnosis Date Noted  . Family history of hypertrophic cardiomyopathy 08/29/2017  . Diabetes mellitus ([place]) 08/20/2017  .  Essential hypertension 08/20/2017  . Hyperlipidemia 08/20/2017  . Mass of pancreas 08/20/2017  . Bilateral  impacted cerumen 02/17/2016  . Diarrhea 06/14/2012  . Migraines   . Pancreatic insufficiency 08/22/2011  . Glucose intolerance (impaired glucose tolerance) 08/22/2011  . Cholelithiasis 07/03/2011  . Mucinous 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/2011  . Hemorrhoids 05/23/2011    Scot Jun, PTA 02/21/2018, 10:59 AM  [place], [place], [address] Phone: [phone]   Fax:  [phone]  Name: Valerie Lawson MRN: [id number] Date of Birth: 03-11-1946

## 2018-02-28 ENCOUNTER — Encounter: Payer: Self-pay | Admitting: Physical Therapy

## 2018-02-28 ENCOUNTER — Ambulatory Visit: Payer: Medicare Other | Admitting: Physical Therapy

## 2018-02-28 DIAGNOSIS — M5441 Lumbago with sciatica, right side: Secondary | ICD-10-CM | POA: Diagnosis not present

## 2018-02-28 DIAGNOSIS — M62830 Muscle spasm of back: Secondary | ICD-10-CM

## 2018-02-28 NOTE — Therapy (Signed)
[place], [place], [address] Phone: [phone]   Fax:  [phone]  Physical Therapy Treatment  Patient Details  Name: Valerie Lawson MRN: [id number] Date of Birth: 12/16/1945 Referring Provider (PT): Aluisio   Encounter Date: 02/28/2018  PT End of Session - 02/28/18 1426    Visit Number  7    Date for PT Re-Evaluation  03/26/18    PT Start Time  5638    PT Stop Time  1435    PT Time Calculation (min)  40 min       Past Medical History:  Diagnosis Date  . Bilateral impacted cerumen 02/17/2016  . Blood in urine   . Cholelithiasis 07/03/2011  . Contact lens/glasses fitting   . Diabetes mellitus ([place]) 08/20/2017  . Diabetes mellitus without complication ([place])   . Diarrhea 06/14/2012  . Essential hypertension 08/20/2017  . Glucose intolerance (impaired glucose tolerance) 08/22/2011  . Hemorrhoids   . Hormone replacement therapy (postmenopausal)    discontinued ten years ago  . Hypercholesterolemia   . Hyperlipidemia   . Hypertension   . Mass of pancreas 08/20/2017  . Menopause   . Migraines   . Mucinous 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/2011  . Pancreatic insufficiency 08/22/2011  . Pancreatic lesion    found on MRI  . Pancreatic mass    states mother died from pancreatic cancer  . Pharyngitis, acute   . PONV (postoperative nausea and vomiting)    with all surgeries  . Rectal bleeding     Past Surgical History:  Procedure Laterality Date  . [place]   x2  . CHOLECYSTECTOMY  08/03/2011   Procedure: LAPAROSCOPIC CHOLECYSTECTOMY;  Surgeon: Stark Klein, MD;  Location: [facility];  Service: General;  Laterality: N/A;  . COLONOSCOPY    . EUS  06/15/2011   Procedure: UPPER ENDOSCOPIC ULTRASOUND (EUS) LINEAR;  Surgeon: Owens Loffler, MD;  [facility]: [facility];  Service: Endoscopy;  Laterality: N/A;  radial linear  . LAPAROSCOPIC SPLENECTOMY  08/03/2011   Procedure:  LAPAROSCOPIC SPLENECTOMY;  Surgeon: Stark Klein, MD;  Location: [facility];  Service: General;  Laterality: N/A;  . OVARIAN CYST REMOVAL  2002 - approximate  . PANCREATECTOMY  08/03/2011   Procedure: LAPAROSCOPIC PANCREATECTOMY;  Surgeon: Stark Klein, MD;  [facility]: [facility];  Service: General;  Laterality: N/A;  distal pancreatectomy  . SPLENECTOMY    . TONSILLECTOMY  1952    There were no vitals filed for this visit.  Subjective Assessment - 02/28/18 1358    Subjective  overalll 80% better. feel it at times with certain mvmt    Currently in Pain?  No/denies                       [facility] Adult PT Treatment/Exercise - 02/28/18 0001      Self-Care   Self-Care  Lifting;ADL's      Exercises   Exercises  Lumbar;Knee/Hip      Lumbar Exercises: Aerobic   Nustep  L 5 7 min      Lumbar Exercises: Machines for Strengthening   Leg Press  30# 2 set 15   isometric ab 15 times 3 sec hold   Other Lumbar Machine Exercise  Rows and lats 20lb 2x15      Lumbar Exercises: Standing   Row  Theraband;20 reps;Both;Strengthening    Theraband Level (Row)  Level 3 (Green)    Shoulder Extension  [employer]  reps;Both;Strengthening    Theraband Level (Shoulder Extension)  Level 3 (Green)    Shoulder ADduction  Strengthening;20 reps;Theraband    Theraband Level (Shoulder Adduction)  Level 3 (Green)    Other Standing Lumbar Exercises  wt ball OH ext 15 rotation 15 each way    Other Standing Lumbar Exercises  mod dead lift 3 # 2 sets 10      Lumbar Exercises: Seated   Sit to Stand  20 reps   with wt ball     Knee/Hip Exercises: Standing   Lateral Step Up  Both;10 reps;Hand Hold: 2;Step Height: 6"   opp leg abd   Forward Step Up  Both;10 reps;Hand Hold: 2;Step Height: 6"   opp leg ext   Walking with Sports Cord  5 times fwd/back,3 times each side      Modalities   Modalities  --      Moist Heat Therapy   Number Minutes Moist Heat  --    Moist Heat Location  --      Engineer, structural Stimulation Location  --    Printmaker Action  --    Electrical Stimulation Parameters  --    Printmaker Goals  --             PT Education - 02/28/18 1429    Education Details  seated HS stretch    Person(s) Educated  Patient    Methods  Explanation;Demonstration    Comprehension  Verbalized understanding;Returned demonstration       PT Short Term Goals - 02/14/18 1248      PT SHORT TERM GOAL #1   Title  independent with initial HEP    Status  Achieved        PT Long Term Goals - 02/28/18 1423      PT LONG TERM GOAL #1   Title  understand proper posture and body mechanics    Status  Achieved      PT LONG TERM GOAL #2   Title  decrease pain 50%    Status  Achieved      PT LONG TERM GOAL #3   Title  increase lumbar ROM 25%    Status  Achieved      PT LONG TERM GOAL #4   Title  be able to go up and down stairs without increase of pain    Status  Partially Met            Plan - 02/28/18 1424    Clinical Impression Statement  80% better per pt reports. verb pain with certain mvmt and lifting-educ on correct BM and lifting tech. added in more standing ex and lifting activities    PT Treatment/Interventions  ADLs/Self Care Home Management;Cryotherapy;Electrical Stimulation;Moist Heat;Traction;Therapeutic activities;Therapeutic exercise;Manual techniques;Patient/family education    PT Next Visit Plan  progress core strength/lifting       Patient will benefit from skilled therapeutic intervention in order to improve the following deficits and impairments:  Decreased range of motion, Increased muscle spasms, Pain, Improper body mechanics, Impaired flexibility, Decreased strength, Postural dysfunction  Visit Diagnosis: Acute right-sided low back pain with right-sided sciatica  Muscle spasm of back     Problem List Patient Active Problem List   Diagnosis Date Noted  . Family history of hypertrophic  cardiomyopathy 08/29/2017  . Diabetes mellitus ([place]) 08/20/2017  . Essential hypertension 08/20/2017  . Hyperlipidemia 08/20/2017  . Mass of pancreas 08/20/2017  . Bilateral impacted cerumen 02/17/2016  .  Diarrhea 06/14/2012  . Migraines   . Pancreatic insufficiency 08/22/2011  . Glucose intolerance (impaired glucose tolerance) 08/22/2011  . Cholelithiasis 07/03/2011  . Mucinous 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/2011  . Hemorrhoids 05/23/2011    PAYSEUR,ANGIE PTA 02/28/2018, 2:33 PM  [place] [address] [place], [place], [address] Phone: [phone]   Fax:  [phone]  Name: PARLEE AMESCUA MRN: [id number] Date of Birth: 09-17-1945

## 2018-03-12 ENCOUNTER — Ambulatory Visit: Payer: Medicare Other | Attending: Student | Admitting: Physical Therapy

## 2018-03-12 DIAGNOSIS — M62830 Muscle spasm of back: Secondary | ICD-10-CM | POA: Insufficient documentation

## 2018-03-12 DIAGNOSIS — M5441 Lumbago with sciatica, right side: Secondary | ICD-10-CM | POA: Diagnosis not present

## 2018-03-12 NOTE — Therapy (Signed)
[place], [place], [address] Phone: [phone]   Fax:  [phone]  Physical Therapy Treatment  Patient Details  Name: Valerie Lawson MRN: [id number] Date of Birth: [birth year] Referring Provider (PT): Aluisio   Encounter Date: 03/12/2018  PT End of Session - 03/12/18 1433    Visit Number  8    Date for PT Re-Evaluation  03/26/18    PT Start Time  6962    PT Stop Time  1438    PT Time Calculation (min)  43 min       Past Medical History:  Diagnosis Date  . Bilateral impacted cerumen 02/17/2016  . Blood in urine   . Cholelithiasis 07/03/2011  . Contact lens/glasses fitting   . Diabetes mellitus ([place]) 08/20/2017  . Diabetes mellitus without complication ([place])   . Diarrhea 06/14/2012  . Essential hypertension 08/20/2017  . Glucose intolerance (impaired glucose tolerance) 08/22/2011  . Hemorrhoids   . Hormone replacement therapy (postmenopausal)    discontinued ten years ago  . Hypercholesterolemia   . Hyperlipidemia   . Hypertension   . Mass of pancreas 08/20/2017  . Menopause   . Migraines   . Mucinous 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/2011  . Pancreatic insufficiency 08/22/2011  . Pancreatic lesion    found on MRI  . Pancreatic mass    states mother died from pancreatic cancer  . Pharyngitis, acute   . PONV (postoperative nausea and vomiting)    with all surgeries  . Rectal bleeding     Past Surgical History:  Procedure Laterality Date  . [place]   x2  . CHOLECYSTECTOMY  08/03/2011   Procedure: LAPAROSCOPIC CHOLECYSTECTOMY;  Surgeon: Stark Klein, MD;  [facility];  Service: General;  Laterality: N/A;  . COLONOSCOPY    . EUS  06/15/2011   Procedure: UPPER ENDOSCOPIC ULTRASOUND (EUS) LINEAR;  Surgeon: Owens Loffler, MD;  [facility];  Service: Endoscopy;  Laterality: N/A;  radial linear  . LAPAROSCOPIC SPLENECTOMY  08/03/2011   Procedure:  LAPAROSCOPIC SPLENECTOMY;  Surgeon: Stark Klein, MD;  [facility];  Service: General;  Laterality: N/A;  . OVARIAN CYST REMOVAL  2002 - approximate  . PANCREATECTOMY  08/03/2011   Procedure: LAPAROSCOPIC PANCREATECTOMY;  Surgeon: Stark Klein, MD;  [facility];  Service: General;  Laterality: N/A;  distal pancreatectomy  . SPLENECTOMY    . TONSILLECTOMY  1952    There were no vitals filed for this visit.  Subjective Assessment - 03/12/18 1400    Subjective  drove back from [place] 8 hours yesterday so I feel it    Currently in Pain?  Yes    Pain Score  4     Pain Location  Back                       [facility] Adult PT Treatment/Exercise - 03/12/18 0001      Exercises   Exercises  Lumbar;Knee/Hip      Lumbar Exercises: Aerobic   Nustep  L 5 7 min      Lumbar Exercises: Machines for Strengthening   Cybex Lumbar Extension  black band 20 times   trunk flex/ext   Leg Press  30# 2 set 15    Other Lumbar Machine Exercise  Rows and lats 20lb 2x15      Lumbar Exercises: Standing   Other Standing Lumbar Exercises  wt ball OH ext 15  rotation 15 each way   pulleys 3 way 10# 15 reps   Other Standing Lumbar Exercises  mod dead lift 3 # 2 sets 10      Lumbar Exercises: Supine   Ab Set  15 reps;3 seconds    Bridge  15 reps;3 seconds;Non-compliant    Other Supine Lumbar Exercises  ball under feet bridge,obl and KTC 15 each      Manual Therapy   Manual Therapy  Passive ROM    Passive ROM  LE and trunk               PT Short Term Goals - 02/14/18 1248      PT SHORT TERM GOAL #1   Title  independent with initial HEP    Status  Achieved        PT Long Term Goals - 03/12/18 1436      PT LONG TERM GOAL #1   Title  understand proper posture and body mechanics    Status  Achieved      PT LONG TERM GOAL #2   Title  decrease pain 50%    Status  Achieved      PT LONG TERM GOAL #3   Title  increase lumbar ROM 25%    Status  Achieved      PT LONG  TERM GOAL #4   Title  be able to go up and down stairs without increase of pain    Status  Achieved            Plan - 03/12/18 1438    Clinical Impression Statement  all goals met. tightness at end range Left HS. pt seeing MD this week- pt good with HEP and using TENS at home as needed    PT Treatment/Interventions  ADLs/Self Care Home Management;Cryotherapy;Electrical Stimulation;Moist Heat;Traction;Therapeutic activities;Therapeutic exercise;Manual techniques;Patient/family education    PT Next Visit Plan  Assess MD appt and look to D/C       Patient will benefit from skilled therapeutic intervention in order to improve the following deficits and impairments:  Decreased range of motion, Increased muscle spasms, Pain, Improper body mechanics, Impaired flexibility, Decreased strength, Postural dysfunction  Visit Diagnosis: Acute right-sided low back pain with right-sided sciatica  Muscle spasm of back     Problem List Patient Active Problem List   Diagnosis Date Noted  . Family history of hypertrophic cardiomyopathy 08/29/2017  . Diabetes mellitus ([place]) 08/20/2017  . Essential hypertension 08/20/2017  . Hyperlipidemia 08/20/2017  . Mass of pancreas 08/20/2017  . Bilateral impacted cerumen 02/17/2016  . Diarrhea 06/14/2012  . Migraines   . Pancreatic insufficiency 08/22/2011  . Glucose intolerance (impaired glucose tolerance) 08/22/2011  . Cholelithiasis 07/03/2011  . Mucinous 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/2011  . Hemorrhoids 05/23/2011    PAYSEUR,ANGIE PTA 03/12/2018, 2:39 PM  [place] [address] [place], [place], [address] Phone: [phone]   Fax:  [phone]  Name: Valerie Lawson MRN: [id number] Date of Birth: Sep 04, 1945

## 2018-03-21 ENCOUNTER — Ambulatory Visit: Payer: Medicare Other | Admitting: Physical Therapy

## 2018-03-21 DIAGNOSIS — M5441 Lumbago with sciatica, right side: Secondary | ICD-10-CM | POA: Diagnosis not present

## 2018-03-21 DIAGNOSIS — M62830 Muscle spasm of back: Secondary | ICD-10-CM

## 2018-03-21 NOTE — Therapy (Signed)
[place], [place], [address] Phone: [phone]   Fax:  [phone]  Physical Therapy Treatment  Patient Details  Name: Valerie Lawson MRN: [id number] Date of Birth: [birth year] Referring Provider (PT): Aluisio   Encounter Date: 03/21/2018  PT End of Session - 03/21/18 1600    Visit Number  9    Date for PT Re-Evaluation  03/26/18    PT Start Time  2595    PT Stop Time  1610    PT Time Calculation (min)  40 min       Past Medical History:  Diagnosis Date  . Bilateral impacted cerumen 02/17/2016  . Blood in urine   . Cholelithiasis 07/03/2011  . Contact lens/glasses fitting   . Diabetes mellitus ([place]) 08/20/2017  . Diabetes mellitus without complication ([place])   . Diarrhea 06/14/2012  . Essential hypertension 08/20/2017  . Glucose intolerance (impaired glucose tolerance) 08/22/2011  . Hemorrhoids   . Hormone replacement therapy (postmenopausal)    discontinued ten years ago  . Hypercholesterolemia   . Hyperlipidemia   . Hypertension   . Mass of pancreas 08/20/2017  . Menopause   . Migraines   . Mucinous 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/2011  . Pancreatic insufficiency 08/22/2011  . Pancreatic lesion    found on MRI  . Pancreatic mass    states mother died from pancreatic cancer  . Pharyngitis, acute   . PONV (postoperative nausea and vomiting)    with all surgeries  . Rectal bleeding     Past Surgical History:  Procedure Laterality Date  . [place]   x2  . CHOLECYSTECTOMY  08/03/2011   Procedure: LAPAROSCOPIC CHOLECYSTECTOMY;  Surgeon: Stark Klein, MD;  [facility];  Service: General;  Laterality: N/A;  . COLONOSCOPY    . EUS  06/15/2011   Procedure: UPPER ENDOSCOPIC ULTRASOUND (EUS) LINEAR;  Surgeon: Owens Loffler, MD;  [facility];  Service: Endoscopy;  Laterality: N/A;  radial linear  . LAPAROSCOPIC SPLENECTOMY  08/03/2011   Procedure:  LAPAROSCOPIC SPLENECTOMY;  Surgeon: Stark Klein, MD;  [facility];  Service: General;  Laterality: N/A;  . OVARIAN CYST REMOVAL  2002 - approximate  . PANCREATECTOMY  08/03/2011   Procedure: LAPAROSCOPIC PANCREATECTOMY;  Surgeon: Stark Klein, MD;  [facility];  Service: General;  Laterality: N/A;  distal pancreatectomy  . SPLENECTOMY    . TONSILLECTOMY  1952    There were no vitals filed for this visit.  Subjective Assessment - 03/21/18 1527    Subjective  doctor feels muscles are problem- i am not sure I agree. Watching grandaughter hurts my back    Currently in Pain?  No/denies                       [facility] Adult PT Treatment/Exercise - 03/21/18 0001      Exercises   Exercises  Lumbar;Knee/Hip      Lumbar Exercises: Aerobic   Nustep  L 5 7 min      Lumbar Exercises: Standing   Other Standing Lumbar Exercises  scap stab 3 way green tband 15 reps   issued for HEP   Other Standing Lumbar Exercises  standing ext green tband 15 reps   issued for HEP     Lumbar Exercises: Supine   Ab Set  15 reps;3 seconds   with ball issued as HEP   Other Supine Lumbar Exercises  ball  under feet bridge,obl and KTC 15 each   issued as HEP 15 rpes     Knee/Hip Exercises: Standing   Other Standing Knee Exercises  green tband hip 3 way 15 reps   issued for HEP            PT Education - 03/21/18 1610    Education Details  core stab, BM,lifting and ADLs    Person(s) Educated  Patient    Methods  Explanation;Demonstration;Handout    Comprehension  Verbalized understanding;Returned demonstration       PT Short Term Goals - 02/14/18 1248      PT SHORT TERM GOAL #1   Title  independent with initial HEP    Status  Achieved        PT Long Term Goals - 03/12/18 1436      PT LONG TERM GOAL #1   Title  understand proper posture and body mechanics    Status  Achieved      PT LONG TERM GOAL #2   Title  decrease pain 50%    Status  Achieved      PT LONG  TERM GOAL #3   Title  increase lumbar ROM 25%    Status  Achieved      PT LONG TERM GOAL #4   Title  be able to go up and down stairs without increase of pain    Status  Achieved            Plan - 03/21/18 1607    Clinical Impression Statement  issued advanced HEP for core/lumb stab. reviewed BM ,lifting and ADLs. all goals met    PT Treatment/Interventions  ADLs/Self Care Home Management;Cryotherapy;Electrical Stimulation;Moist Heat;Traction;Therapeutic activities;Therapeutic exercise;Manual techniques;Patient/family education    PT Next Visit Plan  HOLD       Patient will benefit from skilled therapeutic intervention in order to improve the following deficits and impairments:  Decreased range of motion, Increased muscle spasms, Pain, Improper body mechanics, Impaired flexibility, Decreased strength, Postural dysfunction  Visit Diagnosis: Acute right-sided low back pain with right-sided sciatica  Muscle spasm of back     Problem List Patient Active Problem List   Diagnosis Date Noted  . Family history of hypertrophic cardiomyopathy 08/29/2017  . Diabetes mellitus ([place]) 08/20/2017  . Essential hypertension 08/20/2017  . Hyperlipidemia 08/20/2017  . Mass of pancreas 08/20/2017  . Bilateral impacted cerumen 02/17/2016  . Diarrhea 06/14/2012  . Migraines   . Pancreatic insufficiency 08/22/2011  . Glucose intolerance (impaired glucose tolerance) 08/22/2011  . Cholelithiasis 07/03/2011  . Mucinous 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/2011  . Hemorrhoids 05/23/2011    PAYSEUR,ANGIE  PTA 03/21/2018, 4:11 PM  [place], [place], [address] Phone: [phone]   Fax:  [phone]  Name: Valerie Lawson MRN: [id number] Date of Birth: 02/26/1946

## 2018-04-03 ENCOUNTER — Ambulatory Visit: Payer: Medicare Other | Admitting: Cardiology

## 2018-04-03 ENCOUNTER — Telehealth: Payer: Self-pay | Admitting: *Deleted

## 2018-04-03 VITALS — BP 128/74 | HR 68 | Ht 67.0 in | Wt 180.0 lb

## 2018-04-03 DIAGNOSIS — R002 Palpitations: Secondary | ICD-10-CM | POA: Diagnosis not present

## 2018-04-03 DIAGNOSIS — R55 Syncope and collapse: Secondary | ICD-10-CM

## 2018-04-03 DIAGNOSIS — G4719 Other hypersomnia: Secondary | ICD-10-CM

## 2018-04-03 DIAGNOSIS — I10 Essential (primary) hypertension: Secondary | ICD-10-CM | POA: Diagnosis not present

## 2018-04-03 NOTE — Telephone Encounter (Signed)
-----   Message from Lauralee Evener, [place] sent at 04/03/2018  4:28 PM EDT ----- Per [employer] web poprtal no PA is required. Ok to schedule. Decision ID#: [id number]. ----- Message ----- From: Sarina Ill, RN Sent: 04/03/2018   3:37 PM EDT To: Freada Bergeron, CMA, [facility] Sleep Studies  Hello, Dr. Radford Pax ordered a sleep study. The ESS: 8. Please schedule. Thanks, [employer]

## 2018-04-03 NOTE — Progress Notes (Signed)
Cardiology Office Note    Date:  04/03/2018   ID:  Valerie Lawson, DOB 1946-03-17, MRN [id number]  PCP:  Cari Caraway, MD  Cardiologist:  Fransico Him, MD   Chief Complaint  Patient presents with  . Near Syncope    History of Present Illness:  Valerie Lawson is a 72 y.o. female who is being seen today for the evaluation of Syncope at the request of Cari Caraway, MD.  This is a 72 year old female with a history of hypertension, prediabetes and hyperlipidemia who really saw her PCP several days ago complaining of episodes of presyncope.  This is been going on for several months.  She has not had any frank syncope.  She is also noted occasional skipped beats with some palpitations.  Apparently she was seen by Dr. Bettina Gavia in [facility] in April for an echo to rule out hypertrophic cardiomyopathy because her son was diagnosed with this.  Echo showed normal LV function with EF 60 to 81% and diastolic dysfunction.  She was orthostatic in the PCPs office with a lying blood pressure of 106/66 but upon standing would drop to 83/55 mmHg.  Her HCTZ was stopped her labs were checked and were normal except for mildly elevated BUN at 32.  She says she has been having some problems with sleeping at night as well.  She does not have a problem getting to sleep but some reason she wakes up frequently at night and has a hard time getting back to sleep.  She is tried melatonin but that does not work.  She had been on Ambien in the past but her last PCP left and her new PCP does not want to give her that because of her advanced age.  She denies any chest pain or pressure, shortness of breath, dyspnea on exertion (except when walking up a lot of stairs), PND, orthopnea, lower extremity edema.  She has not had any dizzy spells when she is sitting down these only occur when standing up and walking.  She said they all occur when she is out walking or going to the grocery store and all of a sudden her legs become very  weak and feel like jelly and then she gets dizzy.  She says that she bends over the symptoms resolved.    Past Medical History:  Diagnosis Date  . Bilateral impacted cerumen 02/17/2016  . Blood in urine   . Cholelithiasis 07/03/2011  . Contact lens/glasses fitting   . Diabetes mellitus ([place]) 08/20/2017  . Diabetes mellitus without complication ([place])   . Diarrhea 06/14/2012  . Essential hypertension 08/20/2017  . Glucose intolerance (impaired glucose tolerance) 08/22/2011  . Hemorrhoids   . Hormone replacement therapy (postmenopausal)    discontinued ten years ago  . Hypercholesterolemia   . Hyperlipidemia   . Hypertension   . Mass of pancreas 08/20/2017  . Menopause   . Migraines   . Mucinous 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/2011  . Pancreatic insufficiency 08/22/2011  . Pancreatic lesion    found on MRI  . Pancreatic mass    states mother died from pancreatic cancer  . Pharyngitis, acute   . PONV (postoperative nausea and vomiting)    with all surgeries  . Rectal bleeding     Past Surgical History:  Procedure Laterality Date  . [place]   x2  . CHOLECYSTECTOMY  08/03/2011   Procedure: LAPAROSCOPIC CHOLECYSTECTOMY;  Surgeon: Stark Klein, MD;  Location: [facility];  Service: General;  Laterality: N/A;  . COLONOSCOPY    . EUS  06/15/2011   Procedure: UPPER ENDOSCOPIC ULTRASOUND (EUS) LINEAR;  Surgeon: Owens Loffler, MD;  Location: [facility];  Service: Endoscopy;  Laterality: N/A;  radial linear  . LAPAROSCOPIC SPLENECTOMY  08/03/2011   Procedure: LAPAROSCOPIC SPLENECTOMY;  Surgeon: Stark Klein, MD;  Location: [facility];  Service: General;  Laterality: N/A;  . OVARIAN CYST REMOVAL  2002 - approximate  . PANCREATECTOMY  08/03/2011   Procedure: LAPAROSCOPIC PANCREATECTOMY;  Surgeon: Stark Klein, MD;  Location: [facility];  Service: General;  Laterality: N/A;  distal pancreatectomy  . SPLENECTOMY    . TONSILLECTOMY  1952    Current Medications: Current Meds    Medication Sig  . acetaminophen (TYLENOL) 500 MG tablet Take 500 mg by mouth every 6 (six) hours as needed.  . Cholecalciferol (VITAMIN D3) 2000 units TABS Take 1 tablet by mouth daily.  . cholestyramine (QUESTRAN) 4 GM/DOSE powder Take 0.5 packets (2 g total) by mouth 2 (two) times daily. (Patient taking differently: Take 2 g by mouth every other day. )  . lisinopril (PRINIVIL,ZESTRIL) 40 MG tablet Take 40 mg by mouth daily after breakfast.   . metFORMIN (GLUCOPHAGE-XR) 500 MG 24 hr tablet Take 500 mg by mouth daily.  . Pancrelipase, Lip-Prot-Amyl, (CREON) 24000 UNITS CPEP Take 3 tabs with meals and 1 with snacks. (Patient taking differently: Take 2 caps with meals)  . simvastatin (ZOCOR) 40 MG tablet Take 40 mg by mouth at bedtime.   . Soft Lens Products (OPTI-FREE REWETTING DROPS) SOLN Place 1 drop into both eyes 2 (two) times daily as needed. Dry eyes  . [DISCONTINUED] hydrochlorothiazide (HYDRODIURIL) 25 MG tablet Take 25 mg by mouth daily after breakfast.     Allergies:   Codeine; Sulfa antibiotics; Penicillins; and Ciprofloxacin   Social History   Socioeconomic History  . Marital status: Widowed    Spouse name: Not on file  . Number of children: 2  . Years of education: Not on file  . Highest education level: Not on file  Occupational History  . Occupation: Retired Associate Professor  . Financial resource strain: Not on file  . Food insecurity:    Worry: Not on file    Inability: Not on file  . Transportation needs:    Medical: Not on file    Non-medical: Not on file  Tobacco Use  . Smoking status: Former Smoker    Packs/day: 0.25    Years: 2.00    Pack years: 0.50    Last attempt to quit: 06/05/1968    Years since quitting: 49.8  . Smokeless tobacco: Never Used  Substance and Sexual Activity  . Alcohol use: Yes    Comment: rarely  . Drug use: No  . Sexual activity: Never    Birth control/protection: Post-menopausal  Lifestyle  . Physical activity:    Days  per week: Not on file    Minutes per session: Not on file  . Stress: Not on file  Relationships  . Social connections:    Talks on phone: Not on file    Gets together: Not on file    Attends religious service: Not on file    Active member of club or organization: Not on file    Attends meetings of clubs or organizations: Not on file    Relationship status: Not on file  Other Topics Concern  . Not on file  Social History Narrative  . Not on file  Family History:  The patient's family history includes Cancer in her mother; Celiac disease in her son; Colon polyps in her father; Heart Problems in her son; Hypertension in her son; Hypertrophic cardiomyopathy in her son; Other in her father.   ROS:   Please see the history of present illness.    ROS All other systems reviewed and are negative.  PAD Screen 08/30/2017  Previous PAD dx? No  Previous surgical procedure? No  Pain with walking? No  Feet/toe relief with dangling? No  Painful, non-healing ulcers? No  Extremities discolored? No       PHYSICAL EXAM:   VS:  BP 128/74   Pulse 68   Ht 5\' 7"  (1.702 m)   Wt 180 lb (81.6 kg)   BMI 28.19 kg/m    GEN: Well nourished, well developed, in no acute distress  HEENT: normal  Neck: no JVD, carotid bruits, or masses Cardiac: RRR; no murmurs, rubs, or gallops,no edema.  Intact distal pulses bilaterally.  Respiratory:  clear to auscultation bilaterally, normal work of breathing GI: soft, nontender, nondistended, + BS MS: no deformity or atrophy  Skin: warm and dry, no rash Neuro:  Alert and Oriented x 3, Strength and sensation are intact Psych: euthymic mood, full affect  Wt Readings from Last 3 Encounters:  04/03/18 180 lb (81.6 kg)  08/30/17 172 lb 12.8 oz (78.4 kg)  09/22/14 180 lb 6 oz (81.8 kg)      Studies/Labs Reviewed:   EKG:  EKG is ordered today.  The ekg ordered today demonstrates dermal sinus rhythm at 60 bpm with no ST changes.  Recent Labs: No results  found for requested labs within last 8760 hours.   Lipid Panel No results found for: CHOL, TRIG, HDL, CHOLHDL, VLDL, LDLCALC, LDLDIRECT  Additional studies/ records that were reviewed today include:  Office notes from PCP    ASSESSMENT:    1. Near syncope   2. Essential hypertension   3. Heart palpitations   4. Excessive daytime sleepiness      PLAN:  In order of problems listed above:  1.  Near Syncope -patient has not had any frank syncopal episodes but has had episodes where she has near syncope.  All of these episodes occur when she is upright moving around walking or going shopping.  She all of a sudden will become weak and her legs were feeling generally she gets dizzy.  She can stop her symptoms by bending over.  She has had no symptoms while sitting down.  She says she will notice her heartbeat racing some as well.  She does have a family history of hypertrophic obstructive cardiomyopathy but she had an echo recently in April of this year showing normal LV function with EF 60 to 65% with no regional wall motion normalities and normal thickness of the heart muscle.  There was some diastolic dysfunction.  She was orthostatic in her PCP office consistent with orthostatic hypotension and is now off diuretics.  She has had one dizzy spells since coming off her diuretic.  I am concerned she may have a component of chronotropic incompetence in that her heart rate only went to 75 from 65 when her heart rate dropped from a systolic blood pressure of 106 down to 83 mmHg.  I recommend the following: -Her size treadmill test to assess chronotropic competence -ZIO patch for 2 weeks to rule out significant bradycardia arrhythmias and assess average heart rate -Compression hose which I instructed her to wear during  the day and take off at night -Asked her to let me know if she has any syncopal episodes -See me for my extender back in 4 weeks for further evaluation.  2.  HTN -BP is well  controlled on exam today.  She will continue on lisinopril 40 mg daily.    3.  Palpitations  - as stated #1 I will get a ZIO patch to rule out arrhythmia.    4.  Excessive daytime sleepiness - she is complaining of frequent awakening at night and can get back to sleep.  She does not know what wakes her up.  She does not have a problem going to sleep.  She has tried melatonin but does not help.  She used to take Ambien which helped but her PCP left hand and her primary PCP does not want to prescribe this because of her advanced age. -I will order a split-night sleep study to rule out sleep apnea    Medication Adjustments/Labs and Tests Ordered: Current medicines are reviewed at length with the patient today.  Concerns regarding medicines are outlined above.  Medication changes, Labs and Tests ordered today are listed in the Patient Instructions below.  There are no Patient Instructions on file for this visit.   Signed, Fransico Him, MD  04/03/2018 2:38 PM    [address], [place], [place]  [address] Phone: [phone]; Fax: [phone]

## 2018-04-03 NOTE — Patient Instructions (Addendum)
Medication Instructions:  Your physician recommends that you continue on your current medications as directed. Please refer to the Current Medication list given to you today.  If you need a refill on your cardiac medications before your next appointment, please call your pharmacy.   Lab work:  If you have labs (blood work) drawn today and your tests are completely normal, you will receive your results only by: Marland Kitchen MyChart Message (if you have MyChart) OR . A paper copy in the mail If you have any lab test that is abnormal or we need to change your treatment, we will call you to review the results.  Testing/Procedures: Your physician has recommended that you wear a Zio monitor. Zio monitors are medical devices that record the heart's electrical activity. Doctors most often use these monitors to diagnose arrhythmias. Arrhythmias are problems with the speed or rhythm of the heartbeat.   Your physician has requested that you have an exercise tolerance test. For further information please visit [email or web address]. Please also follow instruction sheet, as given.  Your physician has recommended that you have a sleep study. This test records several body functions during sleep, including: brain activity, eye movement, oxygen and carbon dioxide blood levels, heart rate and rhythm, breathing rate and rhythm, the flow of air through your mouth and nose, snoring, body muscle movements, and chest and belly movement.  Follow-Up: 4 weeks with Dr. Radford Pax.

## 2018-04-03 NOTE — Telephone Encounter (Signed)
Staff message sent to both [facility] per [facility] web portal no PA is required. Ok to schedule sleep study. Decision BE#:[id number].

## 2018-04-03 NOTE — Telephone Encounter (Signed)
-----   Message from Sarina Ill, RN sent at 04/03/2018  3:37 PM EDT ----- Hello, Dr. Radford Pax ordered a sleep study. The ESS: 8. Please schedule. Thanks, [employer]

## 2018-04-05 NOTE — Addendum Note (Signed)
Addended by: Claude Manges on: 04/05/2018 12:39 PM   Modules accepted: Orders

## 2018-04-09 ENCOUNTER — Ambulatory Visit (INDEPENDENT_AMBULATORY_CARE_PROVIDER_SITE_OTHER): Payer: Medicare Other

## 2018-04-09 DIAGNOSIS — R55 Syncope and collapse: Secondary | ICD-10-CM | POA: Diagnosis not present

## 2018-04-09 DIAGNOSIS — R002 Palpitations: Secondary | ICD-10-CM | POA: Diagnosis not present

## 2018-04-09 LAB — EXERCISE TOLERANCE TEST
CSEPED: 4 min
CSEPEW: 6.7 METS
CSEPHR: 88 %
Exercise duration (sec): 45 s
MPHR: 148 {beats}/min
Peak HR: 131 {beats}/min
RPE: 15
Rest HR: 60 {beats}/min

## 2018-04-10 NOTE — Telephone Encounter (Addendum)
Patient is scheduled for lab study on 05/24/18. Patient understands her sleep study will be done at [facility] sleep lab. Patient understands she will receive a sleep packet in a week or so. Patient understands to call if she does not receive the sleep packet in a timely manner. Patient agrees with treatment and thanked me for call.

## 2018-04-15 ENCOUNTER — Encounter: Payer: Self-pay | Admitting: Cardiology

## 2018-05-06 ENCOUNTER — Ambulatory Visit: Payer: Medicare Other | Admitting: Cardiology

## 2018-05-24 ENCOUNTER — Encounter (HOSPITAL_BASED_OUTPATIENT_CLINIC_OR_DEPARTMENT_OTHER): Payer: Medicare Other

## 2018-06-17 ENCOUNTER — Other Ambulatory Visit: Payer: Self-pay | Admitting: Family Medicine

## 2018-06-17 DIAGNOSIS — G4452 New daily persistent headache (NDPH): Secondary | ICD-10-CM

## 2018-06-30 ENCOUNTER — Encounter (HOSPITAL_BASED_OUTPATIENT_CLINIC_OR_DEPARTMENT_OTHER): Payer: Medicare Other

## 2018-07-03 ENCOUNTER — Ambulatory Visit
Admission: RE | Admit: 2018-07-03 | Discharge: 2018-07-03 | Disposition: A | Discharge status: Home / Self Care | Payer: Medicare Other | Source: Ambulatory Visit | Attending: Family Medicine | Admitting: Family Medicine

## 2018-07-03 DIAGNOSIS — G4452 New daily persistent headache (NDPH): Secondary | ICD-10-CM

## 2018-07-03 MED ORDER — GADOBENATE DIMEGLUMINE 529 MG/ML IV SOLN
16.0000 mL | Freq: Once | INTRAVENOUS | Status: AC | PRN
  Administered 2018-07-03: 16 mL via INTRAVENOUS

## 2018-10-28 ENCOUNTER — Encounter: Payer: Self-pay | Admitting: Gastroenterology

## 2019-02-07 IMAGING — MR MR HEAD WO/W CM
12 series · 48 of 48 positions shown · IV contrast (multihance)
Comparison: None.

CLINICAL DATA: Headaches.

EXAM:
MRI HEAD WITHOUT AND WITH CONTRAST
TECHNIQUE: Multiplanar, multiecho pulse sequences of the brain and surrounding
structures were obtained without and with intravenous contrast.
CONTRAST:  16mL MULTIHANCE GADOBENATE DIMEGLUMINE 529 MG/ML IV SOLN

[Series 2: t1_se_sag · sagittal · 5.0mm · 0.45mm/px · 1 of 24 slices shown]
[im 1/24]
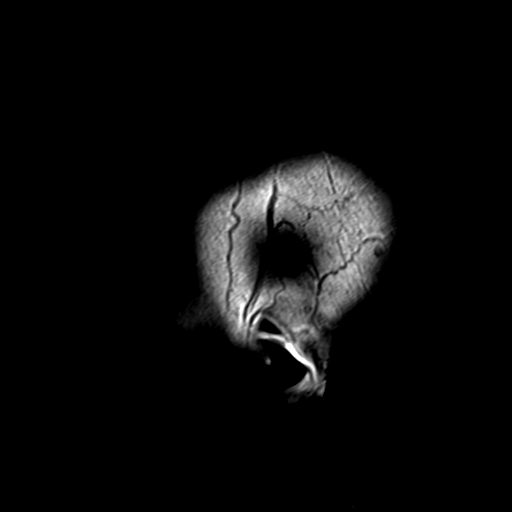

[Series 3: ep2d_diff_(id)_trace · axial · 3.0mm · 1.80mm/px · z∈[-88,+70]mm · 5 of 104 slices shown]
[im 1/104]
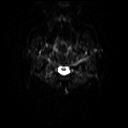
[im 26/104]
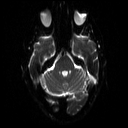
[im 52/104]
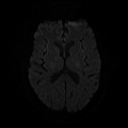
[im 78/104]
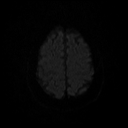
[im 104/104]
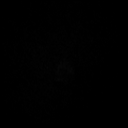

[Series 4: ep2d_diff_(id)_trace_adc · axial · 3.0mm · 1.80mm/px · z∈[-88,+70]mm · 3 of 53 slices shown]
[im 1/53]
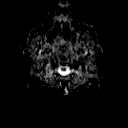
[im 27/53]
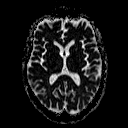
[im 53/53]
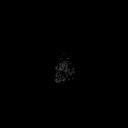

[Series 5: ep2d_diff_cor · coronal · 5.0mm · 1.77mm/px · 4 of 60 slices shown]
[im 1/60]
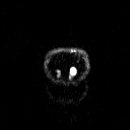
[im 20/60]
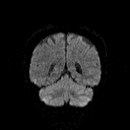
[im 40/60]
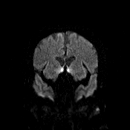
[im 60/60]
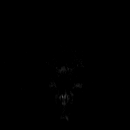

[Series 6: ep2d_diff_cor_adc · coronal · 5.0mm · 1.77mm/px · 2 of 30 slices shown]
[im 1/30]
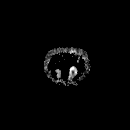
[im 30/30]
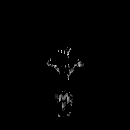

[Series 8: swi_images · axial · 2.0mm · 0.90mm/px · z∈[-87,+71]mm · 5 of 80 slices shown]
[im 1/80]
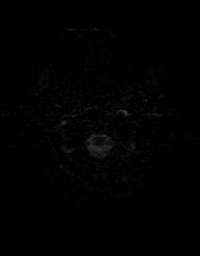
[im 20/80]
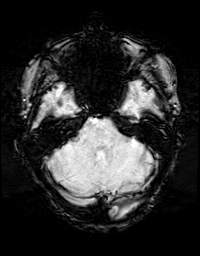
[im 40/80]
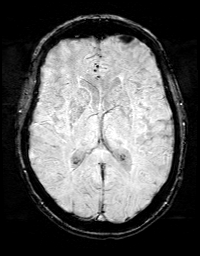
[im 60/80]
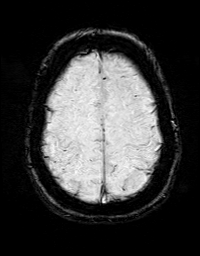
[im 80/80]
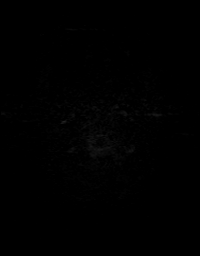

[Series 9: FLAIR · axial · 3.0mm · 0.43mm/px · z∈[-93,+75]mm · 2 of 29 slices shown]
[im 1/29]
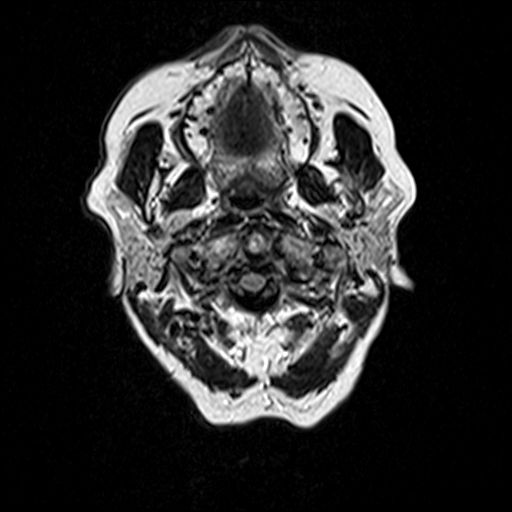
[im 29/29]
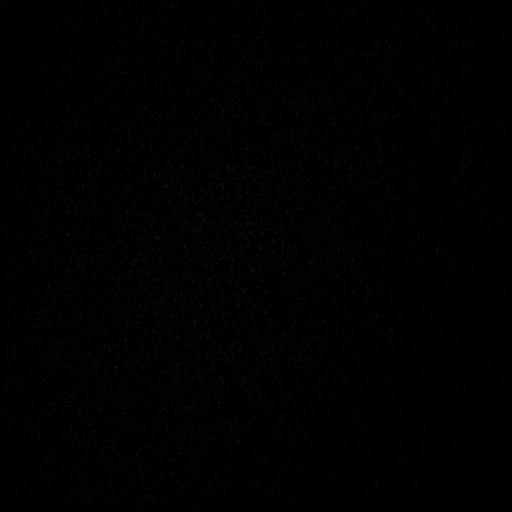

[Series 10: t2_tse_tra_512 · axial · 5.0mm · 0.60mm/px · z∈[-89,+73]mm · 2 of 28 slices shown]
[im 1/28]
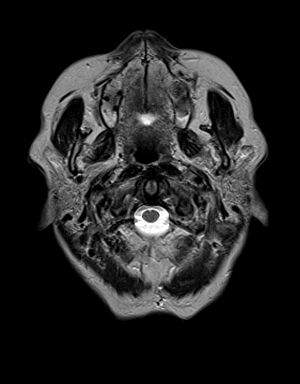
[im 28/28]
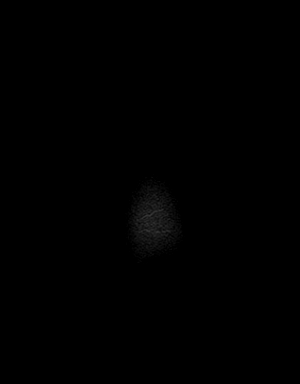

[Series 11: t1_mpr_tra · axial · 1.0mm · 0.72mm/px · z∈[-88,+71]mm · 10 of 160 slices shown]
[im 1/160]
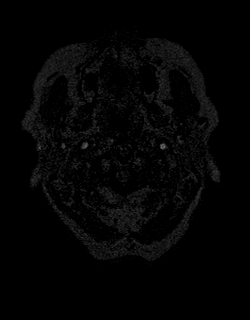
[im 18/160]
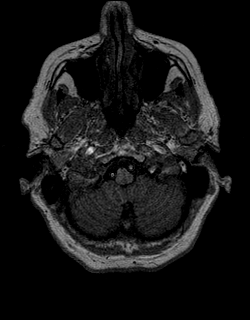
[im 36/160]
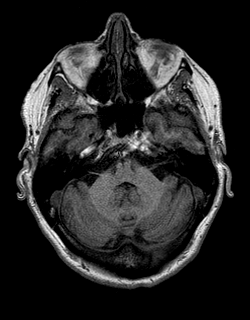
[im 54/160]
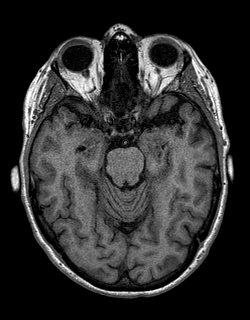
[im 71/160]
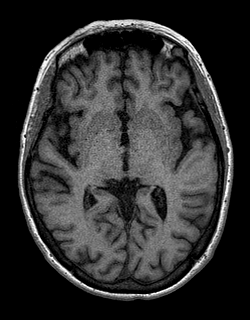
[im 89/160]
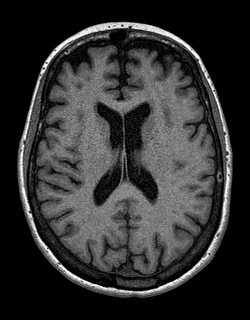
[im 107/160]
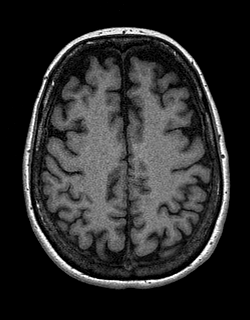
[im 124/160]
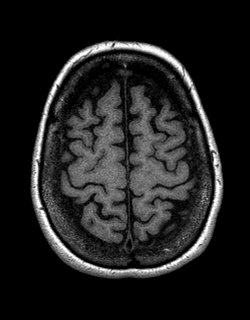
[im 142/160]
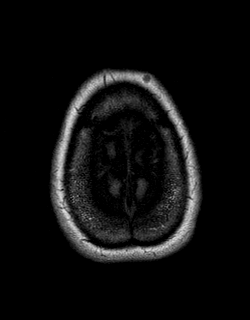
[im 160/160]
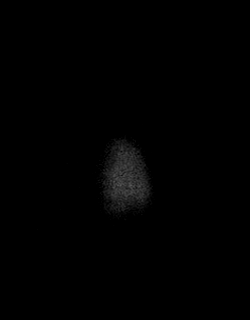

[Series 12: T2 · coronal · 5.0mm · 0.45mm/px · 2 of 31 slices shown]
[im 1/31]
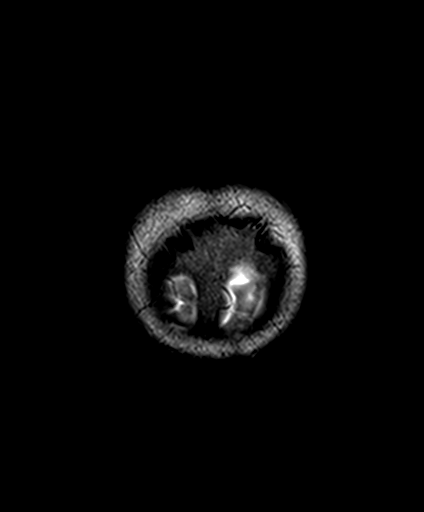
[im 31/31]
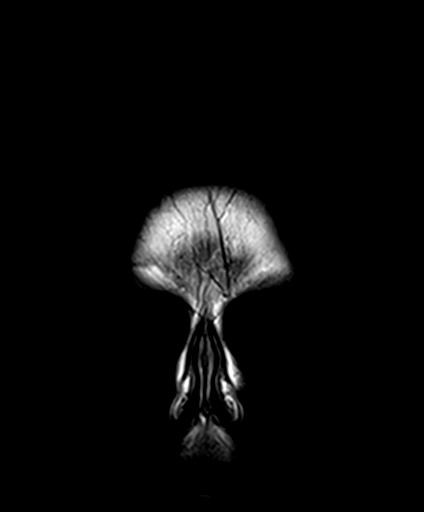

[Series 13: T1 post-contrast · coronal · 5.0mm · 0.72mm/px · 2 of 31 slices shown]
[im 1/31]
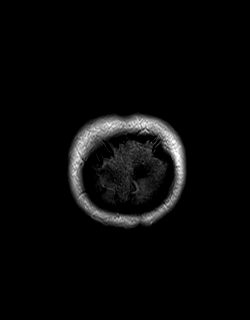
[im 31/31]
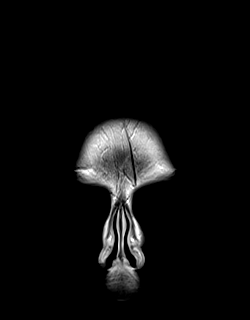

[Series 14: post t1_mpr_tra · axial · 1.0mm · 0.72mm/px · z∈[-88,+71]mm · 10 of 160 slices shown]
[im 1/160]
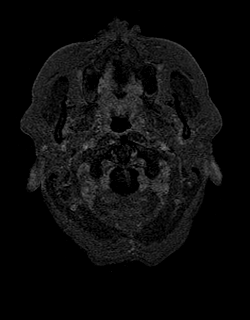
[im 18/160]
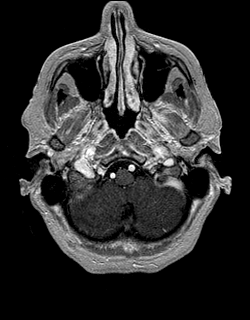
[im 36/160]
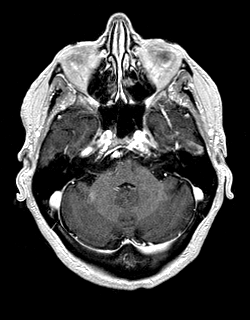
[im 54/160]
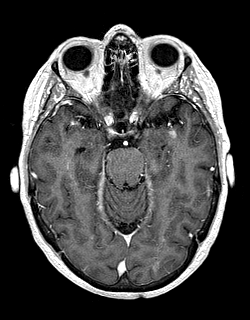
[im 71/160]
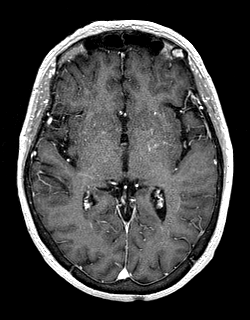
[im 89/160]
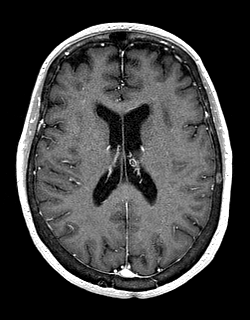
[im 107/160]
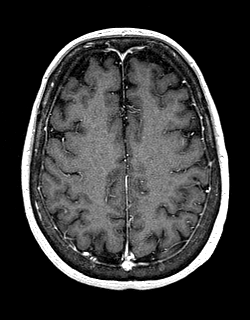
[im 124/160]
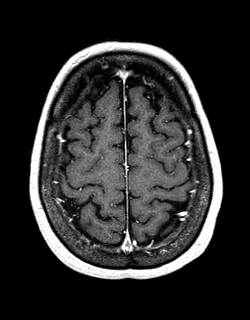
[im 142/160]
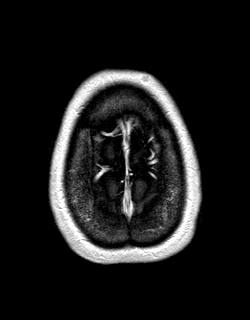
[im 160/160]
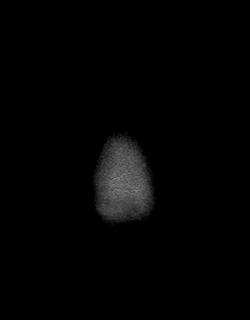

[48 of 48 positions shown; findings below may reference images not displayed]

FINDINGS: Brain: No evidence for acute infarction, hemorrhage, mass lesion,
hydrocephalus, or extra-axial fluid. Normal for age cerebral volume.
Mild subcortical and periventricular T2 and FLAIR hyperintensities,
likely chronic microvascular ischemic change.

Post infusion, no abnormal enhancement of the brain or meninges.

Vascular: Flow voids are maintained throughout the carotid, basilar,
and vertebral arteries. There are no areas of chronic hemorrhage.

Skull and upper cervical spine: Unremarkable visualized calvarium,
skullbase, and cervical vertebrae. Pituitary, pineal, cerebellar
tonsils unremarkable. No upper cervical cord lesions.

Sinuses/Orbits: No orbital masses or proptosis. Globes appear
symmetric. Sinuses appear well aerated, without evidence for
air-fluid level.

Other: No nasopharyngeal pathology or mastoid fluid. Scalp and other
visualized extracranial soft tissues grossly unremarkable.
IMPRESSION: Mild atrophy and small vessel disease. No acute intracranial
findings. No abnormal postcontrast enhancement.

No  intracranial or extracranial cause for headaches is observed.

## 2019-07-04 ENCOUNTER — Ambulatory Visit: Payer: Medicare Other

## 2019-07-11 ENCOUNTER — Ambulatory Visit: Payer: Medicare PPO | Attending: Internal Medicine

## 2019-07-11 DIAGNOSIS — Z23 Encounter for immunization: Secondary | ICD-10-CM

## 2019-07-11 NOTE — Progress Notes (Signed)
   Covid-19 Vaccination Clinic  Name:  Valerie Lawson    MRN: [id number] DOB: [birth year]  07/11/2019  [name] was observed post Covid-19 immunization for 15 minutes without incidence. She was provided with Vaccine Information Sheet and instruction to access the V-Safe system.   [name] was instructed to call 911 with any severe reactions post vaccine: Marland Kitchen Difficulty breathing  . Swelling of your face and throat  . A fast heartbeat  . A bad rash all over your body  . Dizziness and weakness    Immunizations Administered    Name Date Dose VIS Date Route   Pfizer COVID-19 Vaccine 07/11/2019 10:30 AM 0.3 mL 05/16/2019 Intramuscular   Manufacturer: [place]   Lot: [id number]   [place]: [id number]

## 2019-07-15 ENCOUNTER — Ambulatory Visit: Payer: Medicare Other

## 2019-08-05 ENCOUNTER — Ambulatory Visit: Payer: Medicare PPO | Attending: Internal Medicine

## 2019-08-05 DIAGNOSIS — Z23 Encounter for immunization: Secondary | ICD-10-CM | POA: Insufficient documentation

## 2019-08-05 NOTE — Progress Notes (Signed)
   Covid-19 Vaccination Clinic  Name:  ANELE PINKS    MRN: [id number] DOB: 12/06/1945  08/05/2019  Ms. Smead was observed post Covid-19 immunization for 15 minutes without incident. She was provided with Vaccine Information Sheet and instruction to access the V-Safe system.   Ms. [place] was instructed to call 911 with any severe reactions post vaccine: Marland Kitchen Difficulty breathing  . Swelling of face and throat  . A fast heartbeat  . A bad rash all over body  . Dizziness and weakness   Immunizations Administered    Name Date Dose VIS Date Route   Pfizer COVID-19 Vaccine 08/05/2019 10:10 AM 0.3 mL 05/16/2019 Intramuscular   Manufacturer: [place]   Lot: [id number]   [place]: [id number]

## 2019-10-08 DIAGNOSIS — H1032 Unspecified acute conjunctivitis, left eye: Secondary | ICD-10-CM | POA: Diagnosis not present

## 2019-10-08 DIAGNOSIS — H0100A Unspecified blepharitis right eye, upper and lower eyelids: Secondary | ICD-10-CM | POA: Diagnosis not present

## 2019-10-08 DIAGNOSIS — H0100B Unspecified blepharitis left eye, upper and lower eyelids: Secondary | ICD-10-CM | POA: Diagnosis not present

## 2019-10-16 DIAGNOSIS — H25012 Cortical age-related cataract, left eye: Secondary | ICD-10-CM | POA: Diagnosis not present

## 2019-10-16 DIAGNOSIS — H2512 Age-related nuclear cataract, left eye: Secondary | ICD-10-CM | POA: Diagnosis not present

## 2019-10-16 DIAGNOSIS — H25812 Combined forms of age-related cataract, left eye: Secondary | ICD-10-CM | POA: Diagnosis not present

## 2019-10-16 DIAGNOSIS — H2181 Floppy iris syndrome: Secondary | ICD-10-CM | POA: Diagnosis not present

## 2019-12-15 DIAGNOSIS — I10 Essential (primary) hypertension: Secondary | ICD-10-CM | POA: Diagnosis not present

## 2019-12-15 DIAGNOSIS — Z0000 Encounter for general adult medical examination without abnormal findings: Secondary | ICD-10-CM | POA: Diagnosis not present

## 2019-12-15 DIAGNOSIS — E559 Vitamin D deficiency, unspecified: Secondary | ICD-10-CM | POA: Diagnosis not present

## 2019-12-15 DIAGNOSIS — E785 Hyperlipidemia, unspecified: Secondary | ICD-10-CM | POA: Diagnosis not present

## 2019-12-15 DIAGNOSIS — M85852 Other specified disorders of bone density and structure, left thigh: Secondary | ICD-10-CM | POA: Diagnosis not present

## 2019-12-15 DIAGNOSIS — Z1211 Encounter for screening for malignant neoplasm of colon: Secondary | ICD-10-CM | POA: Diagnosis not present

## 2019-12-15 DIAGNOSIS — Q8901 Asplenia (congenital): Secondary | ICD-10-CM | POA: Diagnosis not present

## 2019-12-15 DIAGNOSIS — K8689 Other specified diseases of pancreas: Secondary | ICD-10-CM | POA: Diagnosis not present

## 2019-12-15 DIAGNOSIS — E119 Type 2 diabetes mellitus without complications: Secondary | ICD-10-CM | POA: Diagnosis not present

## 2019-12-19 DIAGNOSIS — M85852 Other specified disorders of bone density and structure, left thigh: Secondary | ICD-10-CM | POA: Diagnosis not present

## 2019-12-19 DIAGNOSIS — Z1389 Encounter for screening for other disorder: Secondary | ICD-10-CM | POA: Diagnosis not present

## 2019-12-19 DIAGNOSIS — Z0000 Encounter for general adult medical examination without abnormal findings: Secondary | ICD-10-CM | POA: Diagnosis not present

## 2019-12-19 DIAGNOSIS — E119 Type 2 diabetes mellitus without complications: Secondary | ICD-10-CM | POA: Diagnosis not present

## 2019-12-19 DIAGNOSIS — I10 Essential (primary) hypertension: Secondary | ICD-10-CM | POA: Diagnosis not present

## 2019-12-19 DIAGNOSIS — K8689 Other specified diseases of pancreas: Secondary | ICD-10-CM | POA: Diagnosis not present

## 2019-12-19 DIAGNOSIS — Z01419 Encounter for gynecological examination (general) (routine) without abnormal findings: Secondary | ICD-10-CM | POA: Diagnosis not present

## 2019-12-19 DIAGNOSIS — E559 Vitamin D deficiency, unspecified: Secondary | ICD-10-CM | POA: Diagnosis not present

## 2019-12-19 DIAGNOSIS — E785 Hyperlipidemia, unspecified: Secondary | ICD-10-CM | POA: Diagnosis not present

## 2020-01-15 DIAGNOSIS — Z1211 Encounter for screening for malignant neoplasm of colon: Secondary | ICD-10-CM | POA: Diagnosis not present

## 2020-01-26 DIAGNOSIS — Z1231 Encounter for screening mammogram for malignant neoplasm of breast: Secondary | ICD-10-CM | POA: Diagnosis not present

## 2020-03-09 DIAGNOSIS — T23211A Burn of second degree of right thumb (nail), initial encounter: Secondary | ICD-10-CM | POA: Diagnosis not present

## 2020-03-09 DIAGNOSIS — T23232A Burn of second degree of multiple left fingers (nail), not including thumb, initial encounter: Secondary | ICD-10-CM | POA: Diagnosis not present

## 2020-03-09 DIAGNOSIS — Z23 Encounter for immunization: Secondary | ICD-10-CM | POA: Diagnosis not present

## 2020-03-09 DIAGNOSIS — X19XXXA Contact with other heat and hot substances, initial encounter: Secondary | ICD-10-CM | POA: Diagnosis not present

## 2020-03-11 DIAGNOSIS — T23211D Burn of second degree of right thumb (nail), subsequent encounter: Secondary | ICD-10-CM | POA: Diagnosis not present

## 2020-05-06 DIAGNOSIS — L219 Seborrheic dermatitis, unspecified: Secondary | ICD-10-CM | POA: Diagnosis not present

## 2020-05-06 DIAGNOSIS — D225 Melanocytic nevi of trunk: Secondary | ICD-10-CM | POA: Diagnosis not present

## 2020-05-06 DIAGNOSIS — L578 Other skin changes due to chronic exposure to nonionizing radiation: Secondary | ICD-10-CM | POA: Diagnosis not present

## 2020-05-06 DIAGNOSIS — D485 Neoplasm of uncertain behavior of skin: Secondary | ICD-10-CM | POA: Diagnosis not present

## 2020-05-06 DIAGNOSIS — L821 Other seborrheic keratosis: Secondary | ICD-10-CM | POA: Diagnosis not present

## 2020-05-06 DIAGNOSIS — Z87898 Personal history of other specified conditions: Secondary | ICD-10-CM | POA: Diagnosis not present

## 2020-05-06 DIAGNOSIS — L814 Other melanin hyperpigmentation: Secondary | ICD-10-CM | POA: Diagnosis not present

## 2020-05-06 DIAGNOSIS — L570 Actinic keratosis: Secondary | ICD-10-CM | POA: Diagnosis not present

## 2020-05-06 DIAGNOSIS — D1800 Hemangioma unspecified site: Secondary | ICD-10-CM | POA: Diagnosis not present

## 2020-07-13 DIAGNOSIS — Q8901 Asplenia (congenital): Secondary | ICD-10-CM | POA: Diagnosis not present

## 2020-07-13 DIAGNOSIS — I10 Essential (primary) hypertension: Secondary | ICD-10-CM | POA: Diagnosis not present

## 2020-07-13 DIAGNOSIS — D136 Benign neoplasm of pancreas: Secondary | ICD-10-CM | POA: Diagnosis not present

## 2020-07-13 DIAGNOSIS — Z0000 Encounter for general adult medical examination without abnormal findings: Secondary | ICD-10-CM | POA: Diagnosis not present

## 2020-07-13 DIAGNOSIS — E785 Hyperlipidemia, unspecified: Secondary | ICD-10-CM | POA: Diagnosis not present

## 2020-07-13 DIAGNOSIS — M85852 Other specified disorders of bone density and structure, left thigh: Secondary | ICD-10-CM | POA: Diagnosis not present

## 2020-07-13 DIAGNOSIS — E119 Type 2 diabetes mellitus without complications: Secondary | ICD-10-CM | POA: Diagnosis not present

## 2020-07-13 DIAGNOSIS — E559 Vitamin D deficiency, unspecified: Secondary | ICD-10-CM | POA: Diagnosis not present

## 2020-07-13 DIAGNOSIS — K8689 Other specified diseases of pancreas: Secondary | ICD-10-CM | POA: Diagnosis not present

## 2020-07-16 DIAGNOSIS — Q8901 Asplenia (congenital): Secondary | ICD-10-CM | POA: Diagnosis not present

## 2020-07-16 DIAGNOSIS — D136 Benign neoplasm of pancreas: Secondary | ICD-10-CM | POA: Diagnosis not present

## 2020-07-16 DIAGNOSIS — I10 Essential (primary) hypertension: Secondary | ICD-10-CM | POA: Diagnosis not present

## 2020-07-16 DIAGNOSIS — E785 Hyperlipidemia, unspecified: Secondary | ICD-10-CM | POA: Diagnosis not present

## 2020-07-16 DIAGNOSIS — K8689 Other specified diseases of pancreas: Secondary | ICD-10-CM | POA: Diagnosis not present

## 2020-07-16 DIAGNOSIS — L659 Nonscarring hair loss, unspecified: Secondary | ICD-10-CM | POA: Diagnosis not present

## 2020-07-16 DIAGNOSIS — J309 Allergic rhinitis, unspecified: Secondary | ICD-10-CM | POA: Diagnosis not present

## 2020-07-16 DIAGNOSIS — E119 Type 2 diabetes mellitus without complications: Secondary | ICD-10-CM | POA: Diagnosis not present

## 2020-07-16 DIAGNOSIS — M85852 Other specified disorders of bone density and structure, left thigh: Secondary | ICD-10-CM | POA: Diagnosis not present

## 2020-08-04 DIAGNOSIS — L570 Actinic keratosis: Secondary | ICD-10-CM | POA: Diagnosis not present

## 2020-08-04 DIAGNOSIS — L219 Seborrheic dermatitis, unspecified: Secondary | ICD-10-CM | POA: Diagnosis not present

## 2020-10-25 DIAGNOSIS — R059 Cough, unspecified: Secondary | ICD-10-CM | POA: Diagnosis not present

## 2020-10-25 DIAGNOSIS — J069 Acute upper respiratory infection, unspecified: Secondary | ICD-10-CM | POA: Diagnosis not present

## 2020-12-16 ENCOUNTER — Other Ambulatory Visit: Payer: Self-pay

## 2020-12-16 ENCOUNTER — Emergency Department (HOSPITAL_BASED_OUTPATIENT_CLINIC_OR_DEPARTMENT_OTHER): Payer: Medicare PPO

## 2020-12-16 ENCOUNTER — Encounter (HOSPITAL_BASED_OUTPATIENT_CLINIC_OR_DEPARTMENT_OTHER): Payer: Self-pay

## 2020-12-16 ENCOUNTER — Emergency Department (HOSPITAL_BASED_OUTPATIENT_CLINIC_OR_DEPARTMENT_OTHER)
Admission: EM | Admit: 2020-12-16 | Discharge: 2020-12-16 | Disposition: A | Discharge status: Home / Self Care | Payer: Medicare PPO | Attending: Emergency Medicine | Admitting: Emergency Medicine

## 2020-12-16 DIAGNOSIS — R002 Palpitations: Secondary | ICD-10-CM | POA: Insufficient documentation

## 2020-12-16 DIAGNOSIS — R42 Dizziness and giddiness: Secondary | ICD-10-CM | POA: Diagnosis not present

## 2020-12-16 DIAGNOSIS — R112 Nausea with vomiting, unspecified: Secondary | ICD-10-CM

## 2020-12-16 DIAGNOSIS — I10 Essential (primary) hypertension: Secondary | ICD-10-CM | POA: Diagnosis not present

## 2020-12-16 DIAGNOSIS — R197 Diarrhea, unspecified: Secondary | ICD-10-CM | POA: Insufficient documentation

## 2020-12-16 DIAGNOSIS — E860 Dehydration: Secondary | ICD-10-CM

## 2020-12-16 DIAGNOSIS — Z8507 Personal history of malignant neoplasm of pancreas: Secondary | ICD-10-CM | POA: Insufficient documentation

## 2020-12-16 DIAGNOSIS — Z87891 Personal history of nicotine dependence: Secondary | ICD-10-CM | POA: Insufficient documentation

## 2020-12-16 DIAGNOSIS — E1169 Type 2 diabetes mellitus with other specified complication: Secondary | ICD-10-CM | POA: Diagnosis not present

## 2020-12-16 DIAGNOSIS — R0602 Shortness of breath: Secondary | ICD-10-CM | POA: Insufficient documentation

## 2020-12-16 DIAGNOSIS — Z7984 Long term (current) use of oral hypoglycemic drugs: Secondary | ICD-10-CM | POA: Diagnosis not present

## 2020-12-16 DIAGNOSIS — I959 Hypotension, unspecified: Secondary | ICD-10-CM | POA: Diagnosis not present

## 2020-12-16 DIAGNOSIS — E785 Hyperlipidemia, unspecified: Secondary | ICD-10-CM | POA: Insufficient documentation

## 2020-12-16 DIAGNOSIS — Z79899 Other long term (current) drug therapy: Secondary | ICD-10-CM | POA: Diagnosis not present

## 2020-12-16 LAB — CBC
HCT: 40.8 % (ref 36.0–46.0)
Hemoglobin: 13.8 g/dL (ref 12.0–15.0)
MCH: 31.7 pg (ref 26.0–34.0)
MCHC: 33.8 g/dL (ref 30.0–36.0)
MCV: 93.8 fL (ref 80.0–100.0)
Platelets: 413 10*3/uL — ABNORMAL HIGH (ref 150–400)
RBC: 4.35 MIL/uL (ref 3.87–5.11)
RDW: 13.1 % (ref 11.5–15.5)
WBC: 10.1 10*3/uL (ref 4.0–10.5)
nRBC: 0 % (ref 0.0–0.2)

## 2020-12-16 LAB — COMPREHENSIVE METABOLIC PANEL
ALT: 21 U/L (ref 0–44)
AST: 25 U/L (ref 15–41)
Albumin: 4.6 g/dL (ref 3.5–5.0)
Alkaline Phosphatase: 53 U/L (ref 38–126)
Anion gap: 9 (ref 5–15)
BUN: 29 mg/dL — ABNORMAL HIGH (ref 8–23)
CO2: 25 mmol/L (ref 22–32)
Calcium: 9.5 mg/dL (ref 8.9–10.3)
Chloride: 103 mmol/L (ref 98–111)
Creatinine, Ser: 0.92 mg/dL (ref 0.44–1.00)
GFR, Estimated: >60 mL/min (ref >60)
Glucose, Bld: 120 mg/dL — ABNORMAL HIGH (ref 70–99)
Potassium: 4.2 mmol/L (ref 3.5–5.1)
Sodium: 137 mmol/L (ref 135–145)
Total Bilirubin: 0.6 mg/dL (ref 0.3–1.2)
Total Protein: 7.8 g/dL (ref 6.5–8.1)

## 2020-12-16 LAB — URINALYSIS, ROUTINE W REFLEX MICROSCOPIC
Glucose, UA: NEGATIVE mg/dL
Hgb urine dipstick: NEGATIVE
Ketones, ur: 15 mg/dL — AB
Leukocytes,Ua: NEGATIVE
Nitrite: NEGATIVE
Protein, ur: NEGATIVE mg/dL
Specific Gravity, Urine: >1.03 — ABNORMAL HIGH (ref 1.005–1.030)
pH: 5.5 (ref 5.0–8.0)

## 2020-12-16 LAB — LIPASE, BLOOD: Lipase: 28 U/L (ref 11–51)

## 2020-12-16 MED ORDER — ONDANSETRON 4 MG PO TBDP
8.0000 mg | ORAL_TABLET | Freq: Once | ORAL | Status: AC
  Administered 2020-12-16: 8 mg via ORAL
  Filled 2020-12-16: qty 2

## 2020-12-16 MED ORDER — SODIUM CHLORIDE 0.9 % IV BOLUS
1000.0000 mL | Freq: Once | INTRAVENOUS | Status: AC
  Administered 2020-12-16: 1000 mL via INTRAVENOUS

## 2020-12-16 MED ORDER — ONDANSETRON HCL 4 MG PO TABS: 4.0000 mg | ORAL_TABLET | Freq: Four times a day (QID) | ORAL | 0 refills | Status: AC

## 2020-12-16 NOTE — ED Notes (Signed)
ED Provider at bedside. 

## 2020-12-16 NOTE — ED Notes (Signed)
Ambulated pt. Around department. No dizzyness or weakness. No complaints. PA-c informed.

## 2020-12-16 NOTE — ED Notes (Signed)
Pt. To x-ray .

## 2020-12-16 NOTE — ED Provider Notes (Signed)
I provided a substantive portion of the care of this patient.  I personally performed the entirety of the history for this encounter.  EKG Interpretation  Date/Time:  Thursday December 16 2020 17:24:05 EDT Ventricular Rate:  65 PR Interval:  237 QRS Duration: 98 QT Interval:  418 QTC Calculation: 435 R Axis:   -5 Text Interpretation: Sinus rhythm Prolonged PR interval Low voltage, precordial leads Abnormal R-wave progression, early transition no ischemic changes, no sig change from old Confirmed by Charlesetta Shanks [phone]) on 12/16/2020 5:36:55 PM   Patient reports at onset about a week ago she had combination of vomiting and diarrhea.  Symptoms have improved with resolution of vomiting.  She has been able to taking oral intake.  Patient reports however she has persisted in having diarrhea about once a day.  She reports sometimes it is explosive and comes on unexpectedly.  No abdominal pain or fever.  She reports she does continue to feel pretty fatigued and lightheaded with standing.  Patient does have exposure to grandchildren who are in daycare.  They have not been sick that she is aware of.  No antibiotic use.  No history of C. difficile.  No recent hospitalization.  Patient is alert and nontoxic.  Mental status clear.  Heart regular lungs clear.  Abdomen soft and nontender.  Skin warm and dry.  No peripheral edema.  I agree with plan of management.   Charlesetta Shanks, MD 12/16/20 [phone]

## 2020-12-16 NOTE — Discharge Instructions (Addendum)
You were seen today in the ED for lightheadedness, nausea, vomiting, diarrhea.  Work-up today was very reassuring.  I have written you prescription for Zofran which can take up to every 6 hours as needed for nausea.  Please continue to use Pepto-Bismol as needed for diarrheal symptoms.  Continue to drink fluids.  I believe her symptoms are most likely due to dehydration, it is likely you had a viral illness that caused to your symptoms.  I expect to be getting better in the next couple of days.  Please schedule a follow-up visit for the next week with your primary care provider for follow-up.  If condition change or worsen please return back to the ED for further evaluation.

## 2020-12-16 NOTE — ED Notes (Signed)
Back from xray. Placed supine.

## 2020-12-16 NOTE — ED Triage Notes (Signed)
Pt c/o n/v/d, feeling light headed x 6 days-denies pain-NAD-steady gait

## 2020-12-16 NOTE — ED Provider Notes (Signed)
[facility] Provider Note   CSN: [id number] Arrival date & time: 12/16/20  1543     History Chief Complaint  Patient presents with   Diarrhea    Valerie Lawson is a 75 y.o. female.  HPI  Patient presents with 6 days of nausea, vomiting, diarrhea.  The first day this happened she felt febrile, but she has not since then.  She had vomiting episodes less than 2 daily for the first 3 days.  None since then.  States she is having 1 loose bowel movement daily.  There is no blood in the stool nor is there black tarry stool.  She is taking Pepto-Bismol which she says alleviated the diarrhea moderately.  She is not having any abdominal pain.    She does report feeling lightheaded and having palpitations. States lightheadedness occurs when she stands up. She states she feels short of breath when she is moving around.  She is not having any leg swelling.  Denies any missed medication doses.  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   Pancreatic insufficiency 08/22/2011   Pancreatic lesion    found on MRI   Pancreatic mass    states mother died from pancreatic cancer   Pharyngitis, acute    PONV (postoperative nausea and vomiting)    with all surgeries   Rectal bleeding     Patient Active Problem List   Diagnosis Date Noted   Near syncope 04/03/2018   Heart palpitations 04/03/2018    Family history of hypertrophic cardiomyopathy 08/29/2017   Diabetes mellitus ([place]) 08/20/2017   Essential hypertension 08/20/2017   Hyperlipidemia 08/20/2017   Mass of pancreas 08/20/2017   Bilateral impacted cerumen 02/17/2016   Diarrhea 06/14/2012   Migraines    Pancreatic insufficiency 08/22/2011   Glucose intolerance (impaired glucose tolerance) 08/22/2011   Cholelithiasis 07/03/2011   Mucinous 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/2011   Hemorrhoids 05/23/2011    Past Surgical History:  Procedure Laterality Date   [place]   x2   CHOLECYSTECTOMY  08/03/2011   Procedure: LAPAROSCOPIC CHOLECYSTECTOMY;  Surgeon: Stark Klein, MD;  Location: [facility];  Service: General;  Laterality: N/A;   COLONOSCOPY     EUS  06/15/2011   Procedure: UPPER ENDOSCOPIC ULTRASOUND (EUS) LINEAR;  Surgeon: Owens Loffler, MD;  Location: [facility];  Service: Endoscopy;  Laterality: N/A;  radial linear   LAPAROSCOPIC SPLENECTOMY  08/03/2011   Procedure: LAPAROSCOPIC SPLENECTOMY;  Surgeon: Stark Klein, MD;  Location: [facility];  Service: General;  Laterality: N/A;   OVARIAN CYST REMOVAL  2002 - approximate   PANCREATECTOMY  08/03/2011   Procedure: LAPAROSCOPIC PANCREATECTOMY;  Surgeon: Stark Klein, MD;  Location: [facility];  Service: General;  Laterality: N/A;  distal pancreatectomy   SPLENECTOMY     TONSILLECTOMY  1952     OB History     Gravida  2  Para  2   Term  2   Preterm      AB      Living  2      SAB      IAB      Ectopic      Multiple      Live Births              Family History  Problem Relation Age of Onset   Cancer Mother        pancreatic   Other Father        respiration problems, bleeding ulcers, cardiac   Colon polyps Father    Hypertrophic cardiomyopathy Son    Heart Problems Son    Celiac disease Son    Hypertension Son    Anesthesia problems Neg Hx    Hypotension Neg Hx    Malignant hyperthermia Neg Hx    Pseudochol deficiency Neg Hx     Colon cancer Neg Hx    Kidney disease Neg Hx    Diabetes Neg Hx    Gallbladder disease Neg Hx    Esophageal cancer Neg Hx    Heart disease Neg Hx     Social History   Tobacco Use   Smoking status: Former    Packs/day: 0.25    Years: 2.00    Pack years: 0.50    Types: Cigarettes    Quit date: 06/05/1968    Years since quitting: 52.5   Smokeless tobacco: Never  Vaping Use   Vaping Use: Never used  Substance Use Topics   Alcohol use: Yes    Comment: rarely   Drug use: No    Home Medications Prior to Admission medications   Medication Sig Start Date End Date Taking? Authorizing Provider  acetaminophen (TYLENOL) 500 MG tablet Take 500 mg by mouth every 6 (six) hours as needed.    [provider]  Cholecalciferol (VITAMIN D3) 2000 units TABS Take 1 tablet by mouth daily.    [provider]  cholestyramine Lucrezia Starch) 4 GM/DOSE powder Take 0.5 packets (2 g total) by mouth 2 (two) times daily. Patient taking differently: Take 2 g by mouth every other day.  06/14/12   Stark Klein, MD  lisinopril (PRINIVIL,ZESTRIL) 40 MG tablet Take 40 mg by mouth daily after breakfast.     [provider]  metFORMIN (GLUCOPHAGE-XR) 500 MG 24 hr tablet Take 500 mg by mouth daily. 08/12/17   [provider]  Pancrelipase, Lip-Prot-Amyl, (CREON) 24000 UNITS CPEP Take 3 tabs with meals and 1 with snacks. Patient taking differently: Take 2 caps with meals 12/22/92   Leighton Ruff, MD  simvastatin (ZOCOR) 40 MG tablet Take 40 mg by mouth at bedtime.     [provider]  Soft Lens Products (OPTI-FREE REWETTING DROPS) SOLN Place 1 drop into both eyes 2 (two) times daily as needed. Dry eyes    [provider]    Allergies    Codeine, Sulfa antibiotics, Morphine and related, Penicillins, and Ciprofloxacin  Review of Systems   Review of Systems  Constitutional:  Positive for fever.  Respiratory:  Positive for shortness of breath. Negative for cough.    Cardiovascular:  Positive for palpitations. Negative for chest pain.  Gastrointestinal:  Positive for diarrhea, nausea and vomiting. Negative for abdominal pain and blood in stool.  Genitourinary:  Negative for dysuria and hematuria.   Physical Exam Updated Vital Signs BP 131/75 (BP Location: Right Arm)   Pulse 60   Temp  98.5 F (36.9 C) (Oral)   Resp 15   Ht 5\' 7"  (1.702 m)   Wt 80.3 kg   SpO2 99%   BMI 27.72 kg/m   Physical Exam Vitals and nursing note reviewed. Exam conducted with a chaperone present.  Constitutional:      Appearance: Normal appearance.  HENT:     Head: Normocephalic and atraumatic.  Eyes:     General: No scleral icterus.       Right eye: No discharge.        Left eye: No discharge.     Extraocular Movements: Extraocular movements intact.     Pupils: Pupils are equal, round, and reactive to light.  Cardiovascular:     Rate and Rhythm: Normal rate and regular rhythm.     Pulses: Normal pulses.     Heart sounds: Normal heart sounds. No murmur heard.   No friction rub. No gallop.  Pulmonary:     Effort: Pulmonary effort is normal. No respiratory distress.     Breath sounds: Normal breath sounds.  Abdominal:     General: Abdomen is flat. Bowel sounds are normal. There is no distension.     Palpations: Abdomen is soft.     Tenderness: There is no abdominal tenderness.  Skin:    General: Skin is warm and dry.     Coloration: Skin is not jaundiced.  Neurological:     Mental Status: She is alert. Mental status is at baseline.     Coordination: Coordination normal.    ED Results / Procedures / Treatments   Labs (all labs ordered are listed, but only abnormal results are displayed) Labs Reviewed  COMPREHENSIVE METABOLIC PANEL - Abnormal; Notable for the following components:      Result Value   Glucose, Bld 120 (*)    BUN 29 (*)    All other components within normal limits  CBC - Abnormal; Notable for the following components:   Platelets 413 (*)     All other components within normal limits  URINALYSIS, ROUTINE W REFLEX MICROSCOPIC - Abnormal; Notable for the following components:   Specific Gravity, Urine >1.030 (*)    Bilirubin Urine MODERATE (*)    Ketones, ur 15 (*)    All other components within normal limits  LIPASE, BLOOD    EKG EKG Interpretation  Date/Time:  Thursday December 16 2020 17:24:05 EDT Ventricular Rate:  65 PR Interval:  237 QRS Duration: 98 QT Interval:  418 QTC Calculation: 435 R Axis:   -5 Text Interpretation: Sinus rhythm Prolonged PR interval Low voltage, precordial leads Abnormal R-wave progression, early transition no ischemic changes, no sig change from old Confirmed by Charlesetta Shanks [phone]) on 12/16/2020 5:36:55 PM  Radiology No results found.  Procedures Procedures   Medications Ordered in ED Medications  sodium chloride 0.9 % bolus 1,000 mL (1,000 mLs Intravenous New Bag/Given 12/16/20 1745)  ondansetron (ZOFRAN-ODT) disintegrating tablet 8 mg (8 mg Oral Given 12/16/20 1721)    ED Course  I have reviewed the triage vital signs and the nursing notes.  Pertinent labs & imaging results that were available during my care of the patient were reviewed by me and considered in my medical decision making (see chart for details).  Clinical Course as of 12/16/20 1911  Thu Dec 16, 2020  1733 Lipase: 28 Doubt pancreatitis  [HS]  1733 Comprehensive metabolic panel(!) BUN elevated, but creatinine WNL. No AKI. No electrolyte derangement from fluid loss  [HS]  1734 Urinalysis, Routine w  reflex microscopic Urine, Clean Catch(!) Signs of dehydration without evidence of UTI [HS]  1734 CBC(!) No anemia or leukocytosis  [HS]  1811 EKG 12-Lead 1st degree AV block, but no SSS or 2nd or 3rd degree AVB. Patient is not bradycardic.  [HS]    Clinical Course User Index [HS] Sherrill Raring, PA-C   MDM Rules/Calculators/A&P                          Patient vitals are currently stable, she is  nontoxic-appearing.  Description of the lightheadedness sounds like orthostatic changes.  Description of the nausea, vomiting, diarrhea sounds like a gastroenteritis type picture to me.  Its possible she is dehydrated leading to orthostatic changes.  I will also work-up for electrolyte derangement from fluid loss.  We will get a chest x-ray and EKG due to feeling short of breath when she moves around.  She has no tenderness on exam making me less suspicious of any acute abdominal etiology.  Orthostatic vitals are reassuring. Please see ED course for interpretation of visit.  Patient is resting comfortably in no acute distress.  Her vitals are stable.  She states improvement with fluids and is no longer feeling as lightheaded when she stands.  I suspect her initial symptoms of lightheadedness were due to fluid loss from gastroenteritis.  I do not see evidence in her work-up of anything requiring emergent management.  I believe discharge with antinausea medicine is appropriate.  She can continue taking Pepto-Bismol as needed.  Advised her to keep drinking fluids and eating food.  Given she is only having 1 episode of diarrhea daily, I am not overly concerned that she is going to continue losing too much fluid.  Do not suspect she has C. difficile infection.  Additionally, she has no recent antibiotic use making C. difficile significantly less likely.  Patient is able to ambulate.  I believe she is appropriate for discharge at this time.  Discussed HPI, physical exam and plan of care for this patient with attending M Pfeiffer. The attending physician evaluated this patient as part of a shared visit and agrees with plan of care.   Final Clinical Impression(s) / ED Diagnoses Final diagnoses:  None    Rx / DC Orders ED Discharge Orders     None        Sherrill Raring, [place] 12/17/20 [place], MD 12/31/20 1551

## 2020-12-30 DIAGNOSIS — E860 Dehydration: Secondary | ICD-10-CM | POA: Diagnosis not present

## 2020-12-30 DIAGNOSIS — R197 Diarrhea, unspecified: Secondary | ICD-10-CM | POA: Diagnosis not present

## 2020-12-31 DIAGNOSIS — H43813 Vitreous degeneration, bilateral: Secondary | ICD-10-CM | POA: Diagnosis not present

## 2020-12-31 DIAGNOSIS — E119 Type 2 diabetes mellitus without complications: Secondary | ICD-10-CM | POA: Diagnosis not present

## 2020-12-31 DIAGNOSIS — H04123 Dry eye syndrome of bilateral lacrimal glands: Secondary | ICD-10-CM | POA: Diagnosis not present

## 2020-12-31 DIAGNOSIS — H524 Presbyopia: Secondary | ICD-10-CM | POA: Diagnosis not present

## 2021-01-11 DIAGNOSIS — Z0000 Encounter for general adult medical examination without abnormal findings: Secondary | ICD-10-CM | POA: Diagnosis not present

## 2021-01-11 DIAGNOSIS — K8689 Other specified diseases of pancreas: Secondary | ICD-10-CM | POA: Diagnosis not present

## 2021-01-11 DIAGNOSIS — Z7984 Long term (current) use of oral hypoglycemic drugs: Secondary | ICD-10-CM | POA: Diagnosis not present

## 2021-01-11 DIAGNOSIS — R197 Diarrhea, unspecified: Secondary | ICD-10-CM | POA: Diagnosis not present

## 2021-01-11 DIAGNOSIS — I10 Essential (primary) hypertension: Secondary | ICD-10-CM | POA: Diagnosis not present

## 2021-01-11 DIAGNOSIS — M85852 Other specified disorders of bone density and structure, left thigh: Secondary | ICD-10-CM | POA: Diagnosis not present

## 2021-01-11 DIAGNOSIS — Z6828 Body mass index (BMI) 28.0-28.9, adult: Secondary | ICD-10-CM | POA: Diagnosis not present

## 2021-01-11 DIAGNOSIS — Q8901 Asplenia (congenital): Secondary | ICD-10-CM | POA: Diagnosis not present

## 2021-01-11 DIAGNOSIS — E785 Hyperlipidemia, unspecified: Secondary | ICD-10-CM | POA: Diagnosis not present

## 2021-01-11 DIAGNOSIS — Z1389 Encounter for screening for other disorder: Secondary | ICD-10-CM | POA: Diagnosis not present

## 2021-01-11 DIAGNOSIS — E559 Vitamin D deficiency, unspecified: Secondary | ICD-10-CM | POA: Diagnosis not present

## 2021-01-11 DIAGNOSIS — L259 Unspecified contact dermatitis, unspecified cause: Secondary | ICD-10-CM | POA: Diagnosis not present

## 2021-01-11 DIAGNOSIS — E119 Type 2 diabetes mellitus without complications: Secondary | ICD-10-CM | POA: Diagnosis not present

## 2021-01-25 DIAGNOSIS — L659 Nonscarring hair loss, unspecified: Secondary | ICD-10-CM | POA: Diagnosis not present

## 2021-01-25 DIAGNOSIS — E559 Vitamin D deficiency, unspecified: Secondary | ICD-10-CM | POA: Diagnosis not present

## 2021-01-25 DIAGNOSIS — E785 Hyperlipidemia, unspecified: Secondary | ICD-10-CM | POA: Diagnosis not present

## 2021-01-25 DIAGNOSIS — M85852 Other specified disorders of bone density and structure, left thigh: Secondary | ICD-10-CM | POA: Diagnosis not present

## 2021-01-25 DIAGNOSIS — J309 Allergic rhinitis, unspecified: Secondary | ICD-10-CM | POA: Diagnosis not present

## 2021-01-25 DIAGNOSIS — I10 Essential (primary) hypertension: Secondary | ICD-10-CM | POA: Diagnosis not present

## 2021-01-25 DIAGNOSIS — E119 Type 2 diabetes mellitus without complications: Secondary | ICD-10-CM | POA: Diagnosis not present

## 2021-01-25 DIAGNOSIS — K8689 Other specified diseases of pancreas: Secondary | ICD-10-CM | POA: Diagnosis not present

## 2021-01-25 DIAGNOSIS — L259 Unspecified contact dermatitis, unspecified cause: Secondary | ICD-10-CM | POA: Diagnosis not present

## 2021-01-31 DIAGNOSIS — Z1231 Encounter for screening mammogram for malignant neoplasm of breast: Secondary | ICD-10-CM | POA: Diagnosis not present

## 2021-05-09 DIAGNOSIS — L821 Other seborrheic keratosis: Secondary | ICD-10-CM | POA: Diagnosis not present

## 2021-05-09 DIAGNOSIS — D0439 Carcinoma in situ of skin of other parts of face: Secondary | ICD-10-CM | POA: Diagnosis not present

## 2021-05-09 DIAGNOSIS — D485 Neoplasm of uncertain behavior of skin: Secondary | ICD-10-CM | POA: Diagnosis not present

## 2021-05-09 DIAGNOSIS — D225 Melanocytic nevi of trunk: Secondary | ICD-10-CM | POA: Diagnosis not present

## 2021-05-09 DIAGNOSIS — Z87898 Personal history of other specified conditions: Secondary | ICD-10-CM | POA: Diagnosis not present

## 2021-05-09 DIAGNOSIS — L570 Actinic keratosis: Secondary | ICD-10-CM | POA: Diagnosis not present

## 2021-05-09 DIAGNOSIS — Z23 Encounter for immunization: Secondary | ICD-10-CM | POA: Diagnosis not present

## 2021-05-09 DIAGNOSIS — L578 Other skin changes due to chronic exposure to nonionizing radiation: Secondary | ICD-10-CM | POA: Diagnosis not present

## 2021-05-09 DIAGNOSIS — D1800 Hemangioma unspecified site: Secondary | ICD-10-CM | POA: Diagnosis not present

## 2021-05-09 DIAGNOSIS — Z85828 Personal history of other malignant neoplasm of skin: Secondary | ICD-10-CM | POA: Diagnosis not present

## 2021-05-09 DIAGNOSIS — D2272 Melanocytic nevi of left lower limb, including hip: Secondary | ICD-10-CM | POA: Diagnosis not present

## 2021-05-09 DIAGNOSIS — L814 Other melanin hyperpigmentation: Secondary | ICD-10-CM | POA: Diagnosis not present

## 2021-06-16 DIAGNOSIS — L905 Scar conditions and fibrosis of skin: Secondary | ICD-10-CM | POA: Diagnosis not present

## 2021-06-16 DIAGNOSIS — D485 Neoplasm of uncertain behavior of skin: Secondary | ICD-10-CM | POA: Diagnosis not present

## 2021-07-21 DIAGNOSIS — Z7984 Long term (current) use of oral hypoglycemic drugs: Secondary | ICD-10-CM | POA: Diagnosis not present

## 2021-07-21 DIAGNOSIS — E119 Type 2 diabetes mellitus without complications: Secondary | ICD-10-CM | POA: Diagnosis not present

## 2021-07-28 DIAGNOSIS — J309 Allergic rhinitis, unspecified: Secondary | ICD-10-CM | POA: Diagnosis not present

## 2021-07-28 DIAGNOSIS — E785 Hyperlipidemia, unspecified: Secondary | ICD-10-CM | POA: Diagnosis not present

## 2021-07-28 DIAGNOSIS — E1169 Type 2 diabetes mellitus with other specified complication: Secondary | ICD-10-CM | POA: Diagnosis not present

## 2021-07-28 DIAGNOSIS — Z7984 Long term (current) use of oral hypoglycemic drugs: Secondary | ICD-10-CM | POA: Diagnosis not present

## 2021-07-28 DIAGNOSIS — M85852 Other specified disorders of bone density and structure, left thigh: Secondary | ICD-10-CM | POA: Diagnosis not present

## 2021-07-28 DIAGNOSIS — I10 Essential (primary) hypertension: Secondary | ICD-10-CM | POA: Diagnosis not present

## 2021-07-28 DIAGNOSIS — R350 Frequency of micturition: Secondary | ICD-10-CM | POA: Diagnosis not present

## 2021-07-28 DIAGNOSIS — K8689 Other specified diseases of pancreas: Secondary | ICD-10-CM | POA: Diagnosis not present

## 2021-07-28 DIAGNOSIS — Q8901 Asplenia (congenital): Secondary | ICD-10-CM | POA: Diagnosis not present

## 2021-08-02 DIAGNOSIS — U071 COVID-19: Secondary | ICD-10-CM | POA: Diagnosis not present

## 2021-08-02 DIAGNOSIS — R350 Frequency of micturition: Secondary | ICD-10-CM | POA: Diagnosis not present

## 2021-11-28 DIAGNOSIS — M25562 Pain in left knee: Secondary | ICD-10-CM | POA: Diagnosis not present

## 2021-11-28 DIAGNOSIS — M1712 Unilateral primary osteoarthritis, left knee: Secondary | ICD-10-CM | POA: Diagnosis not present

## 2022-01-18 DIAGNOSIS — I10 Essential (primary) hypertension: Secondary | ICD-10-CM | POA: Diagnosis not present

## 2022-01-18 DIAGNOSIS — E785 Hyperlipidemia, unspecified: Secondary | ICD-10-CM | POA: Diagnosis not present

## 2022-01-18 DIAGNOSIS — Z1389 Encounter for screening for other disorder: Secondary | ICD-10-CM | POA: Diagnosis not present

## 2022-01-18 DIAGNOSIS — E559 Vitamin D deficiency, unspecified: Secondary | ICD-10-CM | POA: Diagnosis not present

## 2022-01-18 DIAGNOSIS — Z23 Encounter for immunization: Secondary | ICD-10-CM | POA: Diagnosis not present

## 2022-01-18 DIAGNOSIS — E119 Type 2 diabetes mellitus without complications: Secondary | ICD-10-CM | POA: Diagnosis not present

## 2022-01-18 DIAGNOSIS — Z0000 Encounter for general adult medical examination without abnormal findings: Secondary | ICD-10-CM | POA: Diagnosis not present

## 2022-01-27 DIAGNOSIS — Z6829 Body mass index (BMI) 29.0-29.9, adult: Secondary | ICD-10-CM | POA: Diagnosis not present

## 2022-01-27 DIAGNOSIS — E785 Hyperlipidemia, unspecified: Secondary | ICD-10-CM | POA: Diagnosis not present

## 2022-01-27 DIAGNOSIS — K8689 Other specified diseases of pancreas: Secondary | ICD-10-CM | POA: Diagnosis not present

## 2022-01-27 DIAGNOSIS — R152 Fecal urgency: Secondary | ICD-10-CM | POA: Diagnosis not present

## 2022-01-27 DIAGNOSIS — Z23 Encounter for immunization: Secondary | ICD-10-CM | POA: Diagnosis not present

## 2022-01-27 DIAGNOSIS — M85852 Other specified disorders of bone density and structure, left thigh: Secondary | ICD-10-CM | POA: Diagnosis not present

## 2022-01-27 DIAGNOSIS — E119 Type 2 diabetes mellitus without complications: Secondary | ICD-10-CM | POA: Diagnosis not present

## 2022-01-27 DIAGNOSIS — I10 Essential (primary) hypertension: Secondary | ICD-10-CM | POA: Diagnosis not present

## 2022-01-30 DIAGNOSIS — H5201 Hypermetropia, right eye: Secondary | ICD-10-CM | POA: Diagnosis not present

## 2022-01-30 DIAGNOSIS — H524 Presbyopia: Secondary | ICD-10-CM | POA: Diagnosis not present

## 2022-01-30 DIAGNOSIS — H0100B Unspecified blepharitis left eye, upper and lower eyelids: Secondary | ICD-10-CM | POA: Diagnosis not present

## 2022-01-30 DIAGNOSIS — H0100A Unspecified blepharitis right eye, upper and lower eyelids: Secondary | ICD-10-CM | POA: Diagnosis not present

## 2022-01-30 DIAGNOSIS — H5212 Myopia, left eye: Secondary | ICD-10-CM | POA: Diagnosis not present

## 2022-01-30 DIAGNOSIS — E119 Type 2 diabetes mellitus without complications: Secondary | ICD-10-CM | POA: Diagnosis not present

## 2022-01-30 DIAGNOSIS — H04123 Dry eye syndrome of bilateral lacrimal glands: Secondary | ICD-10-CM | POA: Diagnosis not present

## 2022-01-30 DIAGNOSIS — H43813 Vitreous degeneration, bilateral: Secondary | ICD-10-CM | POA: Diagnosis not present

## 2022-02-02 DIAGNOSIS — R8290 Unspecified abnormal findings in urine: Secondary | ICD-10-CM | POA: Diagnosis not present

## 2022-02-02 DIAGNOSIS — R82998 Other abnormal findings in urine: Secondary | ICD-10-CM | POA: Diagnosis not present

## 2022-02-02 DIAGNOSIS — R300 Dysuria: Secondary | ICD-10-CM | POA: Diagnosis not present

## 2022-02-02 DIAGNOSIS — R310 Gross hematuria: Secondary | ICD-10-CM | POA: Diagnosis not present

## 2022-02-08 DIAGNOSIS — Z1231 Encounter for screening mammogram for malignant neoplasm of breast: Secondary | ICD-10-CM | POA: Diagnosis not present

## 2022-02-09 ENCOUNTER — Telehealth: Payer: Self-pay | Admitting: Gastroenterology

## 2022-02-09 NOTE — Telephone Encounter (Signed)
Hi Dr. Tarri Glenn,  We received a referral for patient to be evaluated for screening colonoscopy and follow up on pancreatic insufficiency and fecal urgency/incontinence. The patient has history with Dr. Ardis Hughs and is requesting if at all possible that you see her since Dr. Ardis Hughs is not available at this time.   Please advise on scheduling. Thanks

## 2022-02-22 NOTE — Telephone Encounter (Signed)
Patient has been scheduled for 11/8 at 10:10am

## 2022-02-22 NOTE — Telephone Encounter (Signed)
Called patient to schedule left voicemail. 

## 2022-04-12 ENCOUNTER — Encounter: Payer: Self-pay | Admitting: Gastroenterology

## 2022-04-12 ENCOUNTER — Ambulatory Visit: Payer: Medicare PPO | Admitting: Gastroenterology

## 2022-04-12 ENCOUNTER — Other Ambulatory Visit (HOSPITAL_COMMUNITY): Payer: Self-pay

## 2022-04-12 ENCOUNTER — Other Ambulatory Visit: Payer: Medicare PPO

## 2022-04-12 ENCOUNTER — Telehealth: Payer: Self-pay

## 2022-04-12 VITALS — BP 117/78 | HR 73 | Ht 66.0 in | Wt 173.5 lb

## 2022-04-12 DIAGNOSIS — R140 Abdominal distension (gaseous): Secondary | ICD-10-CM | POA: Diagnosis not present

## 2022-04-12 DIAGNOSIS — K8689 Other specified diseases of pancreas: Secondary | ICD-10-CM

## 2022-04-12 DIAGNOSIS — R159 Full incontinence of feces: Secondary | ICD-10-CM | POA: Diagnosis not present

## 2022-04-12 MED ORDER — RIFAXIMIN 550 MG PO TABS: 550.0000 mg | ORAL_TABLET | Freq: Three times a day (TID) | ORAL | 0 refills | Status: AC

## 2022-04-12 MED ORDER — NA SULFATE-K SULFATE-MG SULF 17.5-3.13-1.6 GM/177ML PO SOLN: 1.0000 | Freq: Once | ORAL | 0 refills | Status: AC

## 2022-04-12 NOTE — Progress Notes (Addendum)
Referring Provider: Cari Caraway, MD Primary Care Physician:  Cari Caraway, MD   Reason for Consultation:  Gas, Diarrhea, Incontinence, Nausea   IMPRESSION:  Recent worsening of fecal incontinence with chronic pancreatic insufficiency Gas and bloating - worse over the last couple of years Partial pancreatectomy, splenectomy, and cholecystectomy for mucinous cystadenoma 2013 Family history of celiac (son) Family history of pancreatic cancer (mother)    PLAN: - Fecal calprotectin, pancreatic elastase - Add a daily stool bulking agent such as Benefiber, Metamucil, or Citrucel - Continue pancreatic enzymes - may need to increase to full dose therapy - Trial of Xifaxan 550 mg TID x 14 days for possible SIBO, substitute with doxycycline 100 mg BID x 14 days if not cost prohibitive - EGD with biopsies to evaluate for IBD and celiac disease - Colonoscopy with biopsies to evaluate for microscopic colitis and IBD   HPI: [name] is a 76 y.o. female patient of Dr. Ardis Hughs last seen in 2016. Returns now on referral from Dr. Leonides Schanz for screening colonoscopy and follow-up on pancreatic insufficiency with fecal urgency/incontinence.  The history is obtained through the patient and review of her electronic health record.  She brings a printout of recent symptoms to review with me today.  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  Fecal urgency started prior to her pancreatic surgery but has worsened over the last 2 to 3 years and is now associated with incontinence, gas, dehydration, frequent urination. Stools are pellets to soft and poorly formed. No associated abdominal pain.  Has 3-5 BMs daily. Rarely while sleeping. Some daytime accidents.  No blood or mucous in the stool.  No abdominal pain.  Previous treated with  Imodium, fiber supplements, cholesytramine, and Creon (using only 2 instead of 3 prior to meals).  Symptoms persist despite removing sugar and most dairy from the diet. She avoids greasy and spicy foods.   She has lost 2-3 pounds since she eliminated sugar.  Appetite is good.   Labs 01/18/2022 showing normal comprehensive metabolic panel except for glucose of 112, hemoglobin A1c 7.3. She was told to increase her metformin at that time but she hasn't implemented the change yet.  Labs 12/16/2020: CBC normal except for platelets of 714.   Last abdominal imaging 2018 with MRI/MRCP showed no acute findings but her expected post-operative changes.   Son has celiac disease and has some similar symptoms.   Endoscopic history:  - Colonoscopy with Dr. Timmothy Euler 2004: Small polyp that was actually a lymphoid aggregate  - Colonoscopy with Dr. Timmothy Euler 2010: Left-sided diverticulosis  - EUS showed a small hiatal hernia, 2.7 m pancreatic cyst, gallstones  She reports more recent colon cancer screening performed with a Cologuard.  Those results were not available to me today.  Mother with pancreatic cancer. There is no known family history of colon cancer or polyps. No other family history of stomach cancer or other GI malignancy. No family history of inflammatory bowel disease or celiac.    Past Medical History:  Diagnosis Date   Bilateral impacted cerumen 02/17/2016   Blood in urine    Cholelithiasis 07/03/2011   Contact lens/glasses fitting    Diabetes mellitus ([place]) 08/20/2017   Diabetes mellitus without complication ([place])    Diarrhea 06/14/2012   Essential hypertension 08/20/2017   Glucose intolerance (impaired glucose tolerance) 08/22/2011   Hemorrhoids    Hormone  replacement therapy (postmenopausal)    discontinued ten years ago   Hypercholesterolemia    Hyperlipidemia    Hypertension    Mass of pancreas 08/20/2017   Menopause    Migraines    Mucinous 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/2011    Pancreatic insufficiency 08/22/2011   Pancreatic lesion    found on MRI   Pancreatic mass    states mother died from pancreatic cancer   Pharyngitis, acute    PONV (postoperative nausea and vomiting)    with all surgeries   Rectal bleeding     Past Surgical History:  Procedure Laterality Date   CESAREAN SECTION  1977, 1979   x2   CHOLECYSTECTOMY  08/03/2011   Procedure: LAPAROSCOPIC CHOLECYSTECTOMY;  Surgeon: Stark Klein, MD;  Location: [facility];  Service: General;  Laterality: N/A;   COLONOSCOPY     EUS  06/15/2011   Procedure: UPPER ENDOSCOPIC ULTRASOUND (EUS) LINEAR;  Surgeon: Owens Loffler, MD;  [facility];  Service: Endoscopy;  Laterality: N/A;  radial linear   LAPAROSCOPIC SPLENECTOMY  08/03/2011   Procedure: LAPAROSCOPIC SPLENECTOMY;  Surgeon: Stark Klein, MD;  Location: [facility];  Service: General;  Laterality: N/A;   OVARIAN CYST REMOVAL  2002 - approximate   PANCREATECTOMY  08/03/2011   Procedure: LAPAROSCOPIC PANCREATECTOMY;  Surgeon: Stark Klein, MD;  Location: [facility];  Service: General;  Laterality: N/A;  distal pancreatectomy   SPLENECTOMY     TONSILLECTOMY  1952      Current Outpatient Medications  Medication Sig Dispense Refill   acetaminophen (TYLENOL) 500 MG tablet Take 500 mg by mouth every 6 (six) hours as needed.     cetirizine (ZYRTEC) 10 MG tablet Take 10 mg by mouth daily.     cholestyramine (QUESTRAN) 4 GM/DOSE powder Take 0.5 packets (2 g total) by mouth 2 (two) times daily. (Patient taking differently: Take 2 g by mouth every other day.) 378 g 12   fluticasone (FLONASE) 50 MCG/ACT nasal spray Place 1 spray into both nostrils daily as needed for allergies or rhinitis.     lisinopril (PRINIVIL,ZESTRIL) 40 MG tablet Take 40 mg by mouth daily after breakfast.      metFORMIN (GLUCOPHAGE-XR) 500 MG 24 hr tablet Take 500 mg by mouth daily.  1   Pancrelipase, Lip-Prot-Amyl, (CREON) 24000 UNITS CPEP Take 3 tabs with meals and 1 with snacks. (Patient  taking differently: Take 2 caps with meals) 180 capsule 1   simvastatin (ZOCOR) 40 MG tablet Take 40 mg by mouth at bedtime.      Cholecalciferol (VITAMIN D3) 2000 units TABS Take 1 tablet by mouth daily. (Patient not taking: Reported on 04/12/2022)     ondansetron (ZOFRAN) 4 MG tablet Take 1 tablet (4 mg total) by mouth every 6 (six) hours. (Patient not taking: Reported on 04/12/2022) 12 tablet 0   Soft Lens Products (OPTI-FREE REWETTING DROPS) SOLN Place 1 drop into both eyes 2 (two) times daily as needed. Dry eyes (Patient not taking: Reported on 04/12/2022)     No current facility-administered medications for this visit.    Allergies as of 04/12/2022 - Review Complete 04/12/2022  Allergen Reaction Noted   Codeine Nausea And Vomiting 04/25/2011   Sulfa antibiotics Hives 04/25/2011   Morphine and related  12/16/2020   Penicillins Other (See Comments) 04/25/2011   Ciprofloxacin Rash 06/15/2011    Family History  Problem Relation Age of Onset   Cancer Mother        pancreatic   Other Father  respiration problems, bleeding ulcers, cardiac   Colon polyps Father    Irritable bowel syndrome Father    Hypertrophic cardiomyopathy Son    Heart Problems Son    Celiac disease Son    Hypertension Son    Anesthesia problems Neg Hx    Hypotension Neg Hx    Malignant hyperthermia Neg Hx    Pseudochol deficiency Neg Hx    Colon cancer Neg Hx    Kidney disease Neg Hx    Diabetes Neg Hx    Gallbladder disease Neg Hx    Esophageal cancer Neg Hx    Heart disease Neg Hx     Social History   Socioeconomic History   Marital status: Widowed    Spouse name: Not on file   Number of children: 2   Years of education: Not on file   Highest education level: Not on file  Occupational History   Occupation: Retired Licensed conveyancer  Tobacco Use   Smoking status: Former    Packs/day: 0.25    Years: 2.00    Total pack years: 0.50    Types: Cigarettes    Quit date: 06/05/1968    Years since  quitting: 53.8   Smokeless tobacco: Never  Vaping Use   Vaping Use: Never used  Substance and Sexual Activity   Alcohol use: Yes    Comment: rarely   Drug use: No   Sexual activity: Never    Birth control/protection: Post-menopausal  Other Topics Concern   Not on file  Social History Narrative   Not on file   Social Determinants of Health   Financial Resource Strain: Not on file  Food Insecurity: Not on file  Transportation Needs: Not on file  Physical Activity: Not on file  Stress: Not on file  Social Connections: Not on file  Intimate Partner Violence: Not on file    Review of Systems: 12 system ROS is negative except as noted above with the addition of allergies, blood in the urine, cough, fatigue, headaches, insomnia, excessive urination, urinary leakage.   Physical Exam: General:   Alert,  well-nourished, pleasant and cooperative in NAD Head:  Normocephalic and atraumatic. Eyes:  Sclera clear, no icterus.   Conjunctiva pink. Ears:  Normal auditory acuity. Nose:  No deformity, discharge,  or lesions. Mouth:  No deformity or lesions.   Neck:  Supple; no masses or thyromegaly. Lungs:  Clear throughout to auscultation.   No wheezes. Heart:  Regular rate and rhythm; no murmurs. Abdomen:  Soft, nontender, nondistended, normal bowel sounds, no rebound or guarding. No hepatosplenomegaly.   Rectal:  Deferred  Msk:  Symmetrical. No boney deformities LAD: No inguinal or umbilical LAD Extremities:  No clubbing or edema. Neurologic:  Alert and  oriented x4;  grossly nonfocal Skin:  Intact without significant lesions or rashes. Psych:  Alert and cooperative. Normal mood and affect.     Elray Dains L. Tarri Glenn, MD, MPH 04/12/2022, 10:50 AM

## 2022-04-12 NOTE — Patient Instructions (Addendum)
It was a pleasure to meet you.  Please stop in the lab for stool studies.   To evaluate your change in bowel habits I recommended some stool studies as well as a colonoscopy and EGD.  I would recommend adding a daily dose of Benefiber to add increased bulk and control of your stools.  We discussed a trial of Xifaxan taken 3 times a day for 2 weeks. This is to treat to bacterial overgrowth.    You have been scheduled for an endoscopy and colonoscopy. Please follow the written instructions given to you at your visit today. Please pick up your prep supplies at the pharmacy within the next 1-3 days. If you use inhalers (even only as needed), please bring them with you on the day of your procedure.  _______________________________________________________  If you are age 61 or older, your body mass index should be between 23-30. Your Body mass index is 28 kg/m. If this is out of the aforementioned range listed, please consider follow up with your Primary Care Provider.  If you are age 52 or younger, your body mass index should be between 19-25. Your Body mass index is 28 kg/m. If this is out of the aformentioned range listed, please consider follow up with your Primary Care Provider.   ________________________________________________________  The [facility] providers would like to encourage you to use [facility] to communicate with providers for non-urgent requests or questions.  Due to long hold times on the telephone, sending your provider a message by [facility] may be a faster and more efficient way to get a response.  Please allow 48 business hours for a response.  Please remember that this is for non-urgent requests.  _______________________________________________________

## 2022-04-12 NOTE — Telephone Encounter (Signed)
Patient Advocate Encounter   Received notification from [employer] that prior authorization for Xifaxan '550mg'$  is required.   PA submitted on 04/12/2022 Key [id number] Status is pending       Joneen Boers, [place] Patient Advocate Specialist [place] Patient Advocate Team Direct Number: [phone] Fax: [phone]

## 2022-04-13 ENCOUNTER — Other Ambulatory Visit (HOSPITAL_COMMUNITY): Payer: Self-pay

## 2022-04-13 NOTE — Telephone Encounter (Signed)
Pharmacy Patient Advocate Encounter  Prior Authorization for Xifaxan '550mg'$  has been approved.    PA# [id number] Effective dates: 06/05/2021 through 06/05/2023

## 2022-04-20 ENCOUNTER — Other Ambulatory Visit: Payer: Medicare PPO

## 2022-04-20 DIAGNOSIS — R140 Abdominal distension (gaseous): Secondary | ICD-10-CM

## 2022-04-20 DIAGNOSIS — R159 Full incontinence of feces: Secondary | ICD-10-CM | POA: Diagnosis not present

## 2022-04-26 LAB — PANCREATIC ELASTASE, FECAL: Pancreatic Elastase-1, Stool: >500 mcg/g

## 2022-04-27 LAB — CALPROTECTIN, FECAL: Calprotectin, Fecal: 14 ug/g (ref 0–120)

## 2022-05-09 DIAGNOSIS — Z85828 Personal history of other malignant neoplasm of skin: Secondary | ICD-10-CM | POA: Diagnosis not present

## 2022-05-09 DIAGNOSIS — C44529 Squamous cell carcinoma of skin of other part of trunk: Secondary | ICD-10-CM | POA: Diagnosis not present

## 2022-05-09 DIAGNOSIS — Z87898 Personal history of other specified conditions: Secondary | ICD-10-CM | POA: Diagnosis not present

## 2022-05-09 DIAGNOSIS — L578 Other skin changes due to chronic exposure to nonionizing radiation: Secondary | ICD-10-CM | POA: Diagnosis not present

## 2022-05-09 DIAGNOSIS — D485 Neoplasm of uncertain behavior of skin: Secondary | ICD-10-CM | POA: Diagnosis not present

## 2022-05-09 DIAGNOSIS — L821 Other seborrheic keratosis: Secondary | ICD-10-CM | POA: Diagnosis not present

## 2022-05-09 DIAGNOSIS — L570 Actinic keratosis: Secondary | ICD-10-CM | POA: Diagnosis not present

## 2022-05-09 DIAGNOSIS — D225 Melanocytic nevi of trunk: Secondary | ICD-10-CM | POA: Diagnosis not present

## 2022-05-09 DIAGNOSIS — L814 Other melanin hyperpigmentation: Secondary | ICD-10-CM | POA: Diagnosis not present

## 2022-05-16 ENCOUNTER — Encounter: Payer: Medicare PPO | Admitting: Gastroenterology

## 2022-06-08 DIAGNOSIS — L905 Scar conditions and fibrosis of skin: Secondary | ICD-10-CM | POA: Diagnosis not present

## 2022-06-08 DIAGNOSIS — C44529 Squamous cell carcinoma of skin of other part of trunk: Secondary | ICD-10-CM | POA: Diagnosis not present

## 2022-06-13 DIAGNOSIS — J069 Acute upper respiratory infection, unspecified: Secondary | ICD-10-CM | POA: Diagnosis not present

## 2022-06-13 DIAGNOSIS — Z6828 Body mass index (BMI) 28.0-28.9, adult: Secondary | ICD-10-CM | POA: Diagnosis not present

## 2022-06-13 DIAGNOSIS — H6123 Impacted cerumen, bilateral: Secondary | ICD-10-CM | POA: Diagnosis not present

## 2022-06-20 ENCOUNTER — Ambulatory Visit (AMBULATORY_SURGERY_CENTER): Payer: Medicare PPO | Admitting: Gastroenterology

## 2022-06-20 ENCOUNTER — Telehealth: Payer: Self-pay

## 2022-06-20 ENCOUNTER — Encounter: Payer: Self-pay | Admitting: Gastroenterology

## 2022-06-20 VITALS — BP 138/82 | HR 77 | Temp 97.5°F | Resp 16 | Ht 66.0 in | Wt 173.0 lb

## 2022-06-20 DIAGNOSIS — E119 Type 2 diabetes mellitus without complications: Secondary | ICD-10-CM | POA: Diagnosis not present

## 2022-06-20 DIAGNOSIS — K259 Gastric ulcer, unspecified as acute or chronic, without hemorrhage or perforation: Secondary | ICD-10-CM | POA: Diagnosis not present

## 2022-06-20 DIAGNOSIS — K219 Gastro-esophageal reflux disease without esophagitis: Secondary | ICD-10-CM | POA: Diagnosis not present

## 2022-06-20 DIAGNOSIS — R159 Full incontinence of feces: Secondary | ICD-10-CM | POA: Diagnosis not present

## 2022-06-20 DIAGNOSIS — I10 Essential (primary) hypertension: Secondary | ICD-10-CM | POA: Diagnosis not present

## 2022-06-20 DIAGNOSIS — D122 Benign neoplasm of ascending colon: Secondary | ICD-10-CM | POA: Diagnosis not present

## 2022-06-20 DIAGNOSIS — R140 Abdominal distension (gaseous): Secondary | ICD-10-CM | POA: Diagnosis not present

## 2022-06-20 DIAGNOSIS — D128 Benign neoplasm of rectum: Secondary | ICD-10-CM | POA: Diagnosis not present

## 2022-06-20 DIAGNOSIS — K621 Rectal polyp: Secondary | ICD-10-CM | POA: Diagnosis not present

## 2022-06-20 DIAGNOSIS — K2980 Duodenitis without bleeding: Secondary | ICD-10-CM | POA: Diagnosis not present

## 2022-06-20 MED ORDER — PANTOPRAZOLE SODIUM 40 MG PO TBEC: 40.0000 mg | DELAYED_RELEASE_TABLET | Freq: Every day | ORAL | 3 refills | Status: DC

## 2022-06-20 MED ORDER — SODIUM CHLORIDE 0.9 % IV SOLN: 500.0000 mL | Freq: Once | INTRAVENOUS | Status: DC

## 2022-06-20 NOTE — Progress Notes (Signed)
Called to room to assist during endoscopic procedure.  Patient ID and intended procedure confirmed with present staff. Received instructions for my participation in the procedure from the performing physician.

## 2022-06-20 NOTE — Progress Notes (Signed)
A and O x3. Report to RN. Tolerated MAC anesthesia well.Teeth unchanged after procedure. 

## 2022-06-20 NOTE — Patient Instructions (Addendum)
- Resume previous diet. - Continue present medications. - Pantoprazole 40 mg daily for at least 8 weeks. - Await pathology results. -  No aspirin, ibuprofen, naproxen, or other  non-steroidal anti-inflammatory drugs. - Continue daily stool bulking agent such as Benefiber or Metamucil. - Repeat colonoscopy for surveillance is not  recommended due to current age (25 years or older). - Emerging evidence supports eating a diet of fruits, vegetables, grains, calcium, and yogurt while reducing red meat and alcohol may reduce the risk of colon cancer. - Office follow-up to review these results. Office will call you with appointment per Dr Tarri Glenn    YOU HAD AN ENDOSCOPIC PROCEDURE TODAY AT THE [facility]:   Refer to the procedure report that was given to you for any specific questions about what was found during the examination.  If the procedure report does not answer your questions, please call your gastroenterologist to clarify.  If you requested that your care partner not be given the details of your procedure findings, then the procedure report has been included in a sealed envelope for you to review at your convenience later.  YOU SHOULD EXPECT: Some feelings of bloating in the abdomen. Passage of more gas than usual.  Walking can help get rid of the air that was put into your GI tract during the procedure and reduce the bloating. If you had a lower endoscopy (such as a colonoscopy or flexible sigmoidoscopy) you may notice spotting of blood in your stool or on the toilet paper. If you underwent a bowel prep for your procedure, you may not have a normal bowel movement for a few days.  Please Note:  You might notice some irritation and congestion in your nose or some drainage.  This is from the oxygen used during your procedure.  There is no need for concern and it should clear up in a day or so.  SYMPTOMS TO REPORT IMMEDIATELY:  Following lower endoscopy (colonoscopy or flexible  sigmoidoscopy):  Excessive amounts of blood in the stool  Significant tenderness or worsening of abdominal pains  Swelling of the abdomen that is new, acute  Fever of 100F or higher  Following upper endoscopy (EGD)  Vomiting of blood or coffee ground material  New chest pain or pain under the shoulder blades  Painful or persistently difficult swallowing  New shortness of breath  Fever of 100F or higher  Black, tarry-looking stools  For urgent or emergent issues, a gastroenterologist can be reached at any hour by calling [phone]. Do not use MyChart messaging for urgent concerns.    DIET:  We do recommend a small meal at first, but then you may proceed to your regular diet.  Drink plenty of fluids but you should avoid alcoholic beverages for 24 hours.  ACTIVITY:  You should plan to take it easy for the rest of today and you should NOT DRIVE or use heavy machinery until tomorrow (because of the sedation medicines used during the test).    FOLLOW UP: Our staff will call the number listed on your records the next business day following your procedure.  We will call around 7:15- 8:00 am to check on you and address any questions or concerns that you may have regarding the information given to you following your procedure. If we do not reach you, we will leave a message.     If any biopsies were taken you will be contacted by phone or by letter within the next 1-3 weeks.  Please call us at [phone] if you have not heard about the biopsies in 3 weeks.    SIGNATURES/CONFIDENTIALITY: You and/or your care partner have signed paperwork which will be entered into your electronic medical record.  These signatures attest to the fact that that the information above on your After Visit Summary has been reviewed and is understood.  Full responsibility of the confidentiality of this discharge information lies with you and/or your care-partner.

## 2022-06-20 NOTE — Op Note (Signed)
[facility] Patient Name: Valerie Lawson Procedure Date: 06/20/2022 9:17 AM MRN: [id number] Endoscopist: Thornton Park MD, MD, [id number] Age: 77 Referring MD:  Date of Birth: 1946/01/25 Gender: Female Account #: [id number] Procedure:                Upper GI endoscopy Indications:              Abdominal bloating, Diarrhea Medicines:                Monitored Anesthesia Care Procedure:                Pre-Anesthesia Assessment:                           - Prior to the procedure, a History and Physical                            was performed, and patient medications and                            allergies were reviewed. The patient's tolerance of                            previous anesthesia was also reviewed. The risks                            and benefits of the procedure and the sedation                            options and risks were discussed with the patient.                            All questions were answered, and informed consent                            was obtained. Prior Anticoagulants: The patient has                            taken no anticoagulant or antiplatelet agents. ASA                            Grade Assessment: III - A patient with severe                            systemic disease. After reviewing the risks and                            benefits, the patient was deemed in satisfactory                            condition to undergo the procedure.                           After obtaining informed consent, the endoscope was  passed under direct vision. Throughout the                            procedure, the patient's blood pressure, pulse, and                            oxygen saturations were monitored continuously. The                            Endoscope was introduced through the mouth, and                            advanced to the third part of duodenum. The upper                            GI endoscopy was  accomplished without difficulty.                            The patient tolerated the procedure well. Scope In: Scope Out: Findings:                 LA Grade A (one or more mucosal breaks less than 5                            mm, not extending between tops of 2 mucosal folds)                            esophagitis with no bleeding was found 38 cm from                            the incisors.                           Patchy mildly erythematous mucosa without bleeding                            was found in the gastric body. There is some                            scarring at the incisura. Biopsies were taken from                            the antrum, body, and fundus with a cold forceps                            for histology. Estimated blood loss was minimal.                           A small hiatal hernia was present.                           Patchy mildly erythematous mucosa without active  bleeding and with no stigmata of bleeding was found                            in the duodenal bulb. Biopsies were taken with a                            cold forceps for histology. Estimated blood loss                            was minimal.                           The cardia and gastric fundus were normal on                            retroflexion.                           The exam was otherwise without abnormality. Complications:            No immediate complications. Estimated Blood Loss:     Estimated blood loss was minimal. Impression:               - LA Grade A reflux esophagitis with no bleeding.                           - Erythematous mucosa in the gastric body. Biopsied.                           - Small hiatal hernia.                           - Erythematous duodenopathy. Biopsied.                           - The examination was otherwise normal. Recommendation:           - Patient has a contact number available for                             emergencies. The signs and symptoms of potential                            delayed complications were discussed with the                            patient. Return to normal activities tomorrow.                            Written discharge instructions were provided to the                            patient.                           - Resume previous diet.                           -  Continue present medications.                           - Pantoprazole 40 mg daily for at least 8 weeks.                           - Await pathology results.                           - No aspirin, ibuprofen, naproxen, or other                            non-steroidal anti-inflammatory drugs. Thornton Park MD, MD 06/20/2022 9:27:57 AM This report has been signed electronically.

## 2022-06-20 NOTE — Progress Notes (Signed)
Referring Provider: Cari Caraway, MD Primary Care Physician:  Cari Caraway, MD   Indication for EGD:  Bloating Indication for Colonoscopy:  Incontinence   IMPRESSION:  Recent worsening of fecal incontinence Gas and bloating Partial pancreatectomy, splenectomy, and cholecystectomy for mucinous cystadenoma 2013 Family history of celiac (son) Family history of pancreatic cancer (mother) Appropriate candidate for monitored anesthesia care  PLAN: EGD and Colonoscopy in the [place] today   HPI: Valerie Lawson is a 77 y.o. female presents for colonoscopy and EGD.  Fecal urgency started prior to her pancreatic surgery but has worsened over the last 2 to 3 years and is now associated with incontinence, gas, dehydration, frequent urination. Stools are pellets to soft and poorly formed. No associated abdominal pain.  Has 3-5 BMs daily. Rarely while sleeping. Some daytime accidents.  No blood or mucous in the stool.  No abdominal pain.   Previous treated with Imodium, fiber supplements, cholesytramine, and Creon (using only 2 instead of 3 prior to meals).   Symptoms persist despite removing sugar and most dairy from the diet. She avoids greasy and spicy foods.    She has lost 2-3 pounds since she eliminated sugar.  Appetite is good.   Labs 01/18/2022 showing normal comprehensive metabolic panel except for glucose of 112, hemoglobin A1c 7.3. She was told to increase her metformin at that time but she hasn't implemented the change yet.  Labs 12/16/2020: CBC normal except for platelets of 714.   Last abdominal imaging 2018 with MRI/MRCP showed no acute findings but her expected post-operative changes.    Son has celiac disease and has some similar symptoms.    Endoscopic history:  - Colonoscopy with Dr. Timmothy Euler 2004: Small polyp that was actually a lymphoid aggregate  - Colonoscopy with Dr. Timmothy Euler 2010: Left-sided diverticulosis  - EUS showed a small hiatal hernia, 2.7 m pancreatic cyst,  gallstones   She reports more recent colon cancer screening performed with a Cologuard.  Those results were not available to me today.   Mother with pancreatic cancer. There is no known family history of colon cancer or polyps. No other family history of stomach cancer or other GI malignancy. No family history of inflammatory bowel disease or celiac.    Past Medical History:  Diagnosis Date   Bilateral impacted cerumen 02/17/2016   Blood in urine    Blood transfusion without reported diagnosis    Cholelithiasis 07/03/2011   Contact lens/glasses fitting    Diabetes mellitus ([place]) 08/20/2017   Diabetes mellitus without complication ([place])    Diarrhea 06/14/2012   Essential hypertension 08/20/2017   Glucose intolerance (impaired glucose tolerance) 08/22/2011   Hemorrhoids    Hormone replacement therapy (postmenopausal)    discontinued ten years ago   Hypercholesterolemia    Hyperlipidemia    Hypertension    Mass of pancreas 08/20/2017   Menopause    Migraines    Mucinous 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/2011   Pancreatic insufficiency 08/22/2011   Pancreatic lesion    found on MRI   Pancreatic mass    states mother died from pancreatic cancer   Pharyngitis, acute    PONV (postoperative nausea and vomiting)    with all surgeries   Rectal bleeding     Past Surgical History:  Procedure Laterality Date   CESAREAN SECTION  1977, 1979   x2   CHOLECYSTECTOMY  08/03/2011   Procedure: LAPAROSCOPIC CHOLECYSTECTOMY;  Surgeon: Stark Klein, MD;  Location: [facility];  Service: General;  Laterality: N/A;  COLONOSCOPY     EUS  06/15/2011   Procedure: UPPER ENDOSCOPIC ULTRASOUND (EUS) LINEAR;  Surgeon: Owens Loffler, MD;  Location: [facility];  Service: Endoscopy;  Laterality: N/A;  radial linear   LAPAROSCOPIC SPLENECTOMY  08/03/2011   Procedure: LAPAROSCOPIC SPLENECTOMY;  Surgeon: Stark Klein, MD;  Location: [facility];  Service: General;  Laterality: N/A;   OVARIAN CYST REMOVAL  2002 -  approximate   PANCREATECTOMY  08/03/2011   Procedure: LAPAROSCOPIC PANCREATECTOMY;  Surgeon: Stark Klein, MD;  Location: [facility];  Service: General;  Laterality: N/A;  distal pancreatectomy   SPLENECTOMY     TONSILLECTOMY  1952    Current Outpatient Medications  Medication Sig Dispense Refill   fluticasone (FLONASE) 50 MCG/ACT nasal spray Place 1 spray into both nostrils daily as needed for allergies or rhinitis.     metFORMIN (GLUCOPHAGE-XR) 500 MG 24 hr tablet Take 500 mg by mouth daily.  1   acetaminophen (TYLENOL) 500 MG tablet Take 500 mg by mouth every 6 (six) hours as needed.     cetirizine (ZYRTEC) 10 MG tablet Take 10 mg by mouth daily.     cholestyramine (QUESTRAN) 4 GM/DOSE powder Take 0.5 packets (2 g total) by mouth 2 (two) times daily. (Patient taking differently: Take 2 g by mouth every other day.) 378 g 12   lisinopril (PRINIVIL,ZESTRIL) 40 MG tablet Take 40 mg by mouth daily after breakfast.      ondansetron (ZOFRAN) 4 MG tablet Take 1 tablet (4 mg total) by mouth every 6 (six) hours. (Patient not taking: Reported on 04/12/2022) 12 tablet 0   Pancrelipase, Lip-Prot-Amyl, (CREON) 24000 UNITS CPEP Take 3 tabs with meals and 1 with snacks. (Patient taking differently: Take 2 caps with meals) 180 capsule 1   simvastatin (ZOCOR) 40 MG tablet Take 40 mg by mouth at bedtime.      Soft Lens Products (OPTI-FREE REWETTING DROPS) SOLN Place 1 drop into both eyes 2 (two) times daily as needed. Dry eyes (Patient not taking: Reported on 04/12/2022)     Current Facility-Administered Medications  Medication Dose Route Frequency Provider Last Rate Last Admin   0.9 %  sodium chloride infusion  500 mL Intravenous Once Thornton Park, MD        Allergies as of 06/20/2022 - Review Complete 06/20/2022  Allergen Reaction Noted   Codeine Nausea And Vomiting 04/25/2011   Sulfa antibiotics Hives 04/25/2011   Morphine and related  12/16/2020   Penicillins Other (See Comments) 04/25/2011    Ciprofloxacin Rash 06/15/2011    Family History  Problem Relation Age of Onset   Cancer Mother        pancreatic   Other Father        respiration problems, bleeding ulcers, cardiac   Colon polyps Father    Irritable bowel syndrome Father    Hypertrophic cardiomyopathy Son    Heart Problems Son    Celiac disease Son    Hypertension Son    Anesthesia problems Neg Hx    Hypotension Neg Hx    Malignant hyperthermia Neg Hx    Pseudochol deficiency Neg Hx    Colon cancer Neg Hx    Kidney disease Neg Hx    Diabetes Neg Hx    Gallbladder disease Neg Hx    Esophageal cancer Neg Hx    Heart disease Neg Hx      Physical Exam: General:   Alert,  well-nourished, pleasant and cooperative in NAD Head:  Normocephalic and atraumatic. Eyes:  Sclera  clear, no icterus.   Conjunctiva pink. Mouth:  No deformity or lesions.   Neck:  Supple; no masses or thyromegaly. Lungs:  Clear throughout to auscultation.   No wheezes. Heart:  Regular rate and rhythm; no murmurs. Abdomen:  Soft, non-tender, nondistended, normal bowel sounds, no rebound or guarding.  Msk:  Symmetrical. No boney deformities LAD: No inguinal or umbilical LAD Extremities:  No clubbing or edema. Neurologic:  Alert and  oriented x4;  grossly nonfocal Skin:  No obvious rash or bruise. Psych:  Alert and cooperative. Normal mood and affect.     Studies/Results: No results found.    Alayzha An L. Tarri Glenn, MD, MPH 06/20/2022, 9:07 AM

## 2022-06-20 NOTE — Telephone Encounter (Signed)
-----  Message from Thornton Park, MD sent at 06/20/2022  9:45 AM EST ----- Please schedule office visit with me in 3-4 weeks. Thanks.  KLB

## 2022-06-20 NOTE — Telephone Encounter (Signed)
OV scheduled for 07/19/22 at 10:50 am with Dr. Tarri Glenn. Pt notified via mychart.

## 2022-06-20 NOTE — Op Note (Signed)
[facility] Patient Name: Valerie Lawson Procedure Date: 06/20/2022 9:04 AM MRN: [id number] Endoscopist: Thornton Park MD, MD, [id number] Age: 77 Referring MD:  Date of Birth: 1946/04/14 Gender: Female Account #: [id number] Procedure:                Colonoscopy Indications:              Change in bowel habits, Clinically significant                            diarrhea of unexplained origin Medicines:                Monitored Anesthesia Care Procedure:                Pre-Anesthesia Assessment:                           - Prior to the procedure, a History and Physical                            was performed, and patient medications and                            allergies were reviewed. The patient's tolerance of                            previous anesthesia was also reviewed. The risks                            and benefits of the procedure and the sedation                            options and risks were discussed with the patient.                            All questions were answered, and informed consent                            was obtained. Prior Anticoagulants: The patient has                            taken no anticoagulant or antiplatelet agents. ASA                            Grade Assessment: III - A patient with severe                            systemic disease. After reviewing the risks and                            benefits, the patient was deemed in satisfactory                            condition to undergo the procedure.  After obtaining informed consent, the colonoscope                            was passed under direct vision. Throughout the                            procedure, the patient's blood pressure, pulse, and                            oxygen saturations were monitored continuously. The                            Olympus CF-HQ190L (Serial# [id number]) Colonoscope was                            introduced through the  anus and advanced to the 3                            cm into the ileum. A second forward view of the                            right colon was performed. The patient tolerated                            the procedure well. The quality of the bowel                            preparation was good. The terminal ileum, ileocecal                            valve, appendiceal orifice, and rectum were                            photographed. The colonoscopy was performed with                            moderate difficulty due to a tortuous colon.                            Successful completion of the procedure was aided by                            changing the patient's position and withdrawing and                            reinserting the scope. Scope In: 9:28:51 AM Scope Out: 9:44:24 AM Scope Withdrawal Time: 0 hours 11 minutes 37 seconds  Total Procedure Duration: 0 hours 15 minutes 33 seconds  Findings:                 The perianal and digital rectal examinations were                            normal.  Multiple medium-mouthed and small-mouthed                            diverticula were found in the sigmoid colon and                            descending colon.                           A 3 mm polyp was found in the rectum. The polyp was                            sessile. The polyp was removed with a cold snare.                            Resection and retrieval were complete. Estimated                            blood loss was minimal.                           A 1 mm polyp was found in the ascending colon. The                            polyp was flat. The polyp was removed with a cold                            biopsy forceps. Resection and retrieval were                            complete. Estimated blood loss was minimal.                           The colon (entire examined portion) appeared                            normal. Biopsies for histology  were taken with a                            cold forceps from the right colon, left colon and                            transverse colon for evaluation of microscopic                            colitis. Estimated blood loss was minimal.                           The terminal ileum appeared normal. Biopsies were                            taken with a cold forceps for histology. Estimated  blood loss was minimal.                           The exam was otherwise without abnormality on                            direct and retroflexion views. Complications:            No immediate complications. Estimated Blood Loss:     Estimated blood loss was minimal. Impression:               - One 3 mm polyp in the rectum, removed with a cold                            snare. Resected and retrieved.                           - One 1 mm polyp in the ascending colon, removed                            with a cold biopsy forceps. Resected and retrieved.                           - The entire examined colon is normal. Biopsied.                           - The examined portion of the ileum was normal.                            Biopsied.                           - The examination was otherwise normal on direct                            and retroflexion views. Recommendation:           - Patient has a contact number available for                            emergencies. The signs and symptoms of potential                            delayed complications were discussed with the                            patient. Return to normal activities tomorrow.                            Written discharge instructions were provided to the                            patient.                           - Resume previous diet.                           -  Continue present medications including pancreatic                            enzymes.                           - Continue daily stool bulking  agent such as                            Benefiber or Metamucil.                           - Await pathology results.                           - Repeat colonoscopy for surveillance is not                            recommended due to current age (11 years or older).                           - Emerging evidence supports eating a diet of                            fruits, vegetables, grains, calcium, and yogurt                            while reducing red meat and alcohol may reduce the                            risk of colon cancer.                           - Office follow-up to review these results. Thornton Park MD, MD 06/20/2022 9:52:32 AM This report has been signed electronically.

## 2022-06-21 ENCOUNTER — Telehealth: Payer: Self-pay

## 2022-06-21 NOTE — Telephone Encounter (Signed)
  Follow up Call-     06/20/2022    8:19 AM  Call back number  Post procedure Call Back phone  # [phone]  Permission to leave phone message Yes     Patient questions:  Do you have a fever, pain , or abdominal swelling? No. Pain Score  0 *  Have you tolerated food without any problems? Yes.    Have you been able to return to your normal activities? Yes.    Do you have any questions about your discharge instructions: Diet   No. Medications  No. Follow up visit  No.  Do you have questions or concerns about your Care? No.  Actions: * If pain score is 4 or above: No action needed, pain <4.

## 2022-06-23 NOTE — Telephone Encounter (Signed)
Patient reminder mailed home.

## 2022-06-26 ENCOUNTER — Encounter: Payer: Self-pay | Admitting: Gastroenterology

## 2022-07-19 ENCOUNTER — Ambulatory Visit: Payer: Medicare PPO | Admitting: Gastroenterology

## 2022-07-19 ENCOUNTER — Encounter: Payer: Self-pay | Admitting: Gastroenterology

## 2022-07-19 DIAGNOSIS — K219 Gastro-esophageal reflux disease without esophagitis: Secondary | ICD-10-CM | POA: Diagnosis not present

## 2022-07-19 DIAGNOSIS — R140 Abdominal distension (gaseous): Secondary | ICD-10-CM

## 2022-07-19 MED ORDER — PANTOPRAZOLE SODIUM 40 MG PO TBEC: 40.0000 mg | DELAYED_RELEASE_TABLET | Freq: Every day | ORAL | 3 refills | Status: AC

## 2022-07-19 NOTE — Progress Notes (Signed)
Referring Provider: Cari Caraway, MD Primary Care Physician:  Cari Caraway, MD   Chief complaint:  Gas, Diarrhea, Incontinence, Nausea   IMPRESSION:  Recent worsening of fecal incontinence with chronic pancreatic insufficiency    - Fecal calprotectin and pancreatic elastase normal    - EGD with duodenal biopsies normal    - Colonoscopy with normal random colon biopsies    - SIBO given response to Xifaxan Gas and bloating - improved with Xifaxan History of colon polyps    - Tubular adenoma on colonoscopy 2024    - no further surveillance colonoscopy planned due to age Partial pancreatectomy, splenectomy, and cholecystectomy for mucinous cystadenoma 2013 Family history of celiac (son) Family history of pancreatic cancer (mother)   PLAN: - Continue daily Benefiber - Continue pancreatic enzymes - may need to increase dose with recurrent symptoms - Complete 10 weeks of pantoprazole 40 mg QAM and then reduce to every other day for one week prior to discontinuing the pantoprazole - Consider repeating Xifaxan if symptoms recur - Consider serial pancreatic imaging for pancreatic cancer screening protocol with MRI given the family history - Office follow-up PRN  HPI: Valerie Lawson is a 77 y.o. female who returns in follow-up after endoscopic evaluation for change in bowel habits.  She is a patient of Dr. Ardis Hughs last seen by him in 2016.  I saw her in the office 04/12/2022  follow-up on pancreatic insufficiency with fecal urgency/incontinence.  The interval history is obtained through the patient and review of her electronic health record.  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  At the time of her consultation with me, she reported fecal urgency that started prior to her pancreatic surgery but had worsened over  the last 2 to 3 years and is now associated with incontinence, gas, dehydration, frequent urination. Stools are pellets to soft and poorly formed. No associated abdominal pain.  Has 3-5 BMs daily. Rarely while sleeping. Some daytime accidents.  No blood or mucous in the stool.  No abdominal pain.  Previous treated with Imodium, fiber supplements, cholesytramine, and Creon (using only 2 instead of 3 prior to meals). Symptoms persist despite removing sugar and most dairy from the diet. She avoids greasy and spicy foods.  She has lost 2-3 pounds since she eliminated sugar.  Appetite is good.   Labs 01/18/2022 showing normal comprehensive metabolic panel except for glucose of 112, hemoglobin A1c 7.3. She was told to increase her metformin at that time but she hasn't implemented the change yet.  Labs 12/16/2020: CBC normal except for platelets of 714.   Last abdominal imaging 2018 with MRI/MRCP showed no acute findings but her expected post-operative changes.   Stool studies in November 2023 show a normal pancreatic elastase and normal fecal calprotectin  Endoscopic evaluation performed last month showed: -Colonoscopy 06/20/2022: Left-sided diverticulosis, 3 mm rectal hyperplastic polyp, 1 mm ascending colon tubular adenoma, otherwise endoscopically normal-appearing colon and TI.  Random colon and terminal ileum biopsies were normal. -EGD 06/20/2022: LA grade a reflux esophagitis, small hiatal hernia, gastritis.  Esophageal biopsies negative, gastric biopsies showed gastritis and focal erosion, duodenal biopsies showed chronic peptic duodenitis.  She returns today in scheduled follow-up.  A trial of Xifaxan 550 mg 3 times daily x 14 days in addition to daily stool bulking agent and pancreatic enzymes has improved her symptoms. Her stools are more formed.  No further leakage. No nocturnal symptoms. No systemic complaints. No blood in the stool.   She has recently been prescribed a z-pack for sinus issues.    Son with celiac disease.  Mother with pancreatic cancer. There is no known family history of colon cancer or polyps. No other family history of stomach cancer or other GI malignancy. No family history of inflammatory bowel disease or celiac.   Endoscopic history:  - Colonoscopy with Dr. Timmothy Euler 2004: Small polyp that was actually a lymphoid aggregate  - Colonoscopy with Dr. Timmothy Euler 2010: Left-sided diverticulosis  - EUS showed a small hiatal hernia, 2.7 m pancreatic cyst, gallstones  - Colonoscopy 06/20/2022: Left-sided diverticulosis, 3 mm rectal hyperplastic polyp, 1 mm ascending colon tubular adenoma, otherwise endoscopically normal-appearing colon and TI.  Random colon and terminal ileum biopsies were normal.  - EGD 06/20/2022: LA grade a reflux esophagitis, small hiatal hernia, gastritis.  Esophageal biopsies negative, gastric biopsies showed gastritis and focal erosion, duodenal biopsies showed chronic peptic duodenitis.   Past Medical History:  Diagnosis Date   Bilateral impacted cerumen 02/17/2016   Blood in urine    Blood transfusion without reported diagnosis    Cholelithiasis 07/03/2011   Contact lens/glasses fitting    Diabetes mellitus ([place]) 08/20/2017   Diabetes mellitus without complication ([place])    Diarrhea 06/14/2012   Essential hypertension 08/20/2017   Glucose intolerance (impaired glucose tolerance) 08/22/2011   Hemorrhoids    Hormone replacement therapy (postmenopausal)    discontinued ten years ago   Hypercholesterolemia    Hyperlipidemia    Hypertension    Mass of pancreas 08/20/2017   Menopause    Migraines    Mucinous 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/2011   Pancreatic insufficiency 08/22/2011   Pancreatic lesion    found on MRI   Pancreatic mass    states mother died from pancreatic cancer   Pharyngitis, acute    PONV (postoperative nausea and vomiting)    with all surgeries   Rectal bleeding     Past Surgical History:  Procedure Laterality Date    CESAREAN SECTION  1977, 1979   x2   CHOLECYSTECTOMY  08/03/2011   Procedure: LAPAROSCOPIC CHOLECYSTECTOMY;  Surgeon: Stark Klein, MD;  Location: [facility];  Service: General;  Laterality: N/A;   COLONOSCOPY     EUS  06/15/2011   Procedure: UPPER ENDOSCOPIC ULTRASOUND (EUS) LINEAR;  Surgeon: Owens Loffler, MD;  Location: [facility];  Service: Endoscopy;  Laterality: N/A;  radial linear   LAPAROSCOPIC SPLENECTOMY  08/03/2011   Procedure: LAPAROSCOPIC SPLENECTOMY;  Surgeon: Stark Klein, MD;  Location: [facility];  Service: General;  Laterality: N/A;   OVARIAN CYST REMOVAL  2002 - approximate   PANCREATECTOMY  08/03/2011   Procedure: LAPAROSCOPIC PANCREATECTOMY;  Surgeon: Stark Klein, MD;  Location: [facility];  Service: General;  Laterality: N/A;  distal pancreatectomy   SPLENECTOMY     TONSILLECTOMY  1952      Current Outpatient Medications  Medication Sig Dispense Refill   acetaminophen (TYLENOL) 500 MG tablet Take 500 mg by mouth every 6 (six) hours as needed.     cetirizine (ZYRTEC) 10 MG tablet Take 10 mg by mouth daily.     cholestyramine (QUESTRAN) 4 GM/DOSE powder Take 0.5 packets (2 g total) by mouth 2 (two) times daily. (Patient taking differently: Take 2 g by mouth every other day.) 378 g 12   diclofenac Sodium (VOLTAREN) 1 % GEL as needed.     fluticasone (FLONASE) 50 MCG/ACT nasal spray Place 1  spray into both nostrils daily as needed for allergies or rhinitis.     lisinopril (PRINIVIL,ZESTRIL) 40 MG tablet Take 40 mg by mouth daily after breakfast.      metFORMIN (GLUCOPHAGE-XR) 500 MG 24 hr tablet Take 500 mg by mouth daily.  1   ondansetron (ZOFRAN) 4 MG tablet Take 1 tablet (4 mg total) by mouth every 6 (six) hours. 12 tablet 0   Pancrelipase, Lip-Prot-Amyl, (CREON) 24000 UNITS CPEP Take 3 tabs with meals and 1 with snacks. (Patient taking differently: Take 2 caps with meals) 180 capsule 1   pantoprazole (PROTONIX) 40 MG tablet Take 1 tablet (40 mg total) by mouth daily. 30 tablet 3    simvastatin (ZOCOR) 40 MG tablet Take 40 mg by mouth at bedtime.      No current facility-administered medications for this visit.    Allergies as of 07/19/2022 - Review Complete 07/19/2022  Allergen Reaction Noted   Codeine Nausea And Vomiting 04/25/2011   Sulfa antibiotics Hives 04/25/2011   Morphine and related  12/16/2020   Penicillins Other (See Comments) 04/25/2011   Ciprofloxacin Rash 06/15/2011    Family History  Problem Relation Age of Onset   Cancer Mother        pancreatic   Other Father        respiration problems, bleeding ulcers, cardiac   Colon polyps Father    Irritable bowel syndrome Father    Hypertrophic cardiomyopathy Son    Heart Problems Son    Celiac disease Son    Hypertension Son    Anesthesia problems Neg Hx    Hypotension Neg Hx    Malignant hyperthermia Neg Hx    Pseudochol deficiency Neg Hx    Colon cancer Neg Hx    Kidney disease Neg Hx    Diabetes Neg Hx    Gallbladder disease Neg Hx    Esophageal cancer Neg Hx    Heart disease Neg Hx       Physical Exam: General:   Alert,  well-nourished, pleasant and cooperative in NAD Head:  Normocephalic and atraumatic. Eyes:  Sclera clear, no icterus.   Conjunctiva pink. Abdomen:  Soft, nontender, nondistended, normal bowel sounds, no rebound or guarding. No hepatosplenomegaly.   Neurologic:  Alert and  oriented x4;  grossly nonfocal Skin:  Intact without significant lesions or rashes. Psych:  Alert and cooperative. Normal mood and affect.     Quentin Shorey L. Tarri Glenn, MD, MPH 07/19/2022, 10:48 AM

## 2022-07-19 NOTE — Patient Instructions (Addendum)
Continue Benefiber and pancreatic enzymes.   Complete pantoprazole 40 twice daily for at least 10 weeks. Then, reduce to 40 mg every morning for at least one more week before discontinuing the pantoprazole.   Please let me know if your diarrhea returns or you have any other questions or concerns.   Office follow-up recommended as needed.

## 2022-07-24 DIAGNOSIS — E1169 Type 2 diabetes mellitus with other specified complication: Secondary | ICD-10-CM | POA: Diagnosis not present

## 2022-07-24 DIAGNOSIS — E559 Vitamin D deficiency, unspecified: Secondary | ICD-10-CM | POA: Diagnosis not present

## 2022-07-31 DIAGNOSIS — J309 Allergic rhinitis, unspecified: Secondary | ICD-10-CM | POA: Diagnosis not present

## 2022-07-31 DIAGNOSIS — Q8901 Asplenia (congenital): Secondary | ICD-10-CM | POA: Diagnosis not present

## 2022-07-31 DIAGNOSIS — H6121 Impacted cerumen, right ear: Secondary | ICD-10-CM | POA: Diagnosis not present

## 2022-07-31 DIAGNOSIS — I10 Essential (primary) hypertension: Secondary | ICD-10-CM | POA: Diagnosis not present

## 2022-07-31 DIAGNOSIS — K8689 Other specified diseases of pancreas: Secondary | ICD-10-CM | POA: Diagnosis not present

## 2022-07-31 DIAGNOSIS — E119 Type 2 diabetes mellitus without complications: Secondary | ICD-10-CM | POA: Diagnosis not present

## 2022-07-31 DIAGNOSIS — E785 Hyperlipidemia, unspecified: Secondary | ICD-10-CM | POA: Diagnosis not present

## 2022-07-31 DIAGNOSIS — Z6828 Body mass index (BMI) 28.0-28.9, adult: Secondary | ICD-10-CM | POA: Diagnosis not present

## 2022-07-31 DIAGNOSIS — M85852 Other specified disorders of bone density and structure, left thigh: Secondary | ICD-10-CM | POA: Diagnosis not present

## 2022-08-21 DIAGNOSIS — H6121 Impacted cerumen, right ear: Secondary | ICD-10-CM | POA: Diagnosis not present

## 2023-01-24 DIAGNOSIS — E785 Hyperlipidemia, unspecified: Secondary | ICD-10-CM | POA: Diagnosis not present

## 2023-01-24 DIAGNOSIS — E119 Type 2 diabetes mellitus without complications: Secondary | ICD-10-CM | POA: Diagnosis not present

## 2023-01-24 DIAGNOSIS — E559 Vitamin D deficiency, unspecified: Secondary | ICD-10-CM | POA: Diagnosis not present

## 2023-01-29 DIAGNOSIS — Q8901 Asplenia (congenital): Secondary | ICD-10-CM | POA: Diagnosis not present

## 2023-01-29 DIAGNOSIS — J309 Allergic rhinitis, unspecified: Secondary | ICD-10-CM | POA: Diagnosis not present

## 2023-01-29 DIAGNOSIS — E785 Hyperlipidemia, unspecified: Secondary | ICD-10-CM | POA: Diagnosis not present

## 2023-01-29 DIAGNOSIS — L821 Other seborrheic keratosis: Secondary | ICD-10-CM | POA: Diagnosis not present

## 2023-01-29 DIAGNOSIS — M85852 Other specified disorders of bone density and structure, left thigh: Secondary | ICD-10-CM | POA: Diagnosis not present

## 2023-01-29 DIAGNOSIS — I10 Essential (primary) hypertension: Secondary | ICD-10-CM | POA: Diagnosis not present

## 2023-01-29 DIAGNOSIS — K8689 Other specified diseases of pancreas: Secondary | ICD-10-CM | POA: Diagnosis not present

## 2023-01-29 DIAGNOSIS — E1165 Type 2 diabetes mellitus with hyperglycemia: Secondary | ICD-10-CM | POA: Diagnosis not present

## 2023-01-29 DIAGNOSIS — G479 Sleep disorder, unspecified: Secondary | ICD-10-CM | POA: Diagnosis not present

## 2023-02-14 DIAGNOSIS — M8588 Other specified disorders of bone density and structure, other site: Secondary | ICD-10-CM | POA: Diagnosis not present

## 2023-02-14 DIAGNOSIS — Z1231 Encounter for screening mammogram for malignant neoplasm of breast: Secondary | ICD-10-CM | POA: Diagnosis not present

## 2023-02-14 DIAGNOSIS — E349 Endocrine disorder, unspecified: Secondary | ICD-10-CM | POA: Diagnosis not present

## 2023-02-27 DIAGNOSIS — Z1331 Encounter for screening for depression: Secondary | ICD-10-CM | POA: Diagnosis not present

## 2023-02-27 DIAGNOSIS — Z0000 Encounter for general adult medical examination without abnormal findings: Secondary | ICD-10-CM | POA: Diagnosis not present

## 2023-02-27 DIAGNOSIS — Z6828 Body mass index (BMI) 28.0-28.9, adult: Secondary | ICD-10-CM | POA: Diagnosis not present

## 2023-02-27 DIAGNOSIS — Z23 Encounter for immunization: Secondary | ICD-10-CM | POA: Diagnosis not present

## 2023-05-08 DIAGNOSIS — E119 Type 2 diabetes mellitus without complications: Secondary | ICD-10-CM | POA: Diagnosis not present

## 2023-05-08 DIAGNOSIS — K219 Gastro-esophageal reflux disease without esophagitis: Secondary | ICD-10-CM | POA: Diagnosis not present

## 2023-05-08 DIAGNOSIS — E785 Hyperlipidemia, unspecified: Secondary | ICD-10-CM | POA: Diagnosis not present

## 2023-05-08 DIAGNOSIS — J309 Allergic rhinitis, unspecified: Secondary | ICD-10-CM | POA: Diagnosis not present

## 2023-05-08 DIAGNOSIS — J300 Vasomotor rhinitis: Secondary | ICD-10-CM | POA: Diagnosis not present

## 2023-05-08 DIAGNOSIS — M8580 Other specified disorders of bone density and structure, unspecified site: Secondary | ICD-10-CM | POA: Diagnosis not present

## 2023-05-08 DIAGNOSIS — I10 Essential (primary) hypertension: Secondary | ICD-10-CM | POA: Diagnosis not present

## 2023-05-08 DIAGNOSIS — R32 Unspecified urinary incontinence: Secondary | ICD-10-CM | POA: Diagnosis not present

## 2023-05-08 DIAGNOSIS — M1990 Unspecified osteoarthritis, unspecified site: Secondary | ICD-10-CM | POA: Diagnosis not present

## 2023-05-11 DIAGNOSIS — D2362 Other benign neoplasm of skin of left upper limb, including shoulder: Secondary | ICD-10-CM | POA: Diagnosis not present

## 2023-05-11 DIAGNOSIS — L821 Other seborrheic keratosis: Secondary | ICD-10-CM | POA: Diagnosis not present

## 2023-05-11 DIAGNOSIS — L820 Inflamed seborrheic keratosis: Secondary | ICD-10-CM | POA: Diagnosis not present

## 2023-05-11 DIAGNOSIS — D485 Neoplasm of uncertain behavior of skin: Secondary | ICD-10-CM | POA: Diagnosis not present

## 2023-05-11 DIAGNOSIS — Z85828 Personal history of other malignant neoplasm of skin: Secondary | ICD-10-CM | POA: Diagnosis not present

## 2023-05-11 DIAGNOSIS — L570 Actinic keratosis: Secondary | ICD-10-CM | POA: Diagnosis not present

## 2023-05-11 DIAGNOSIS — D225 Melanocytic nevi of trunk: Secondary | ICD-10-CM | POA: Diagnosis not present

## 2023-05-11 DIAGNOSIS — Z87898 Personal history of other specified conditions: Secondary | ICD-10-CM | POA: Diagnosis not present

## 2023-05-11 DIAGNOSIS — L578 Other skin changes due to chronic exposure to nonionizing radiation: Secondary | ICD-10-CM | POA: Diagnosis not present

## 2023-06-08 DIAGNOSIS — H524 Presbyopia: Secondary | ICD-10-CM | POA: Diagnosis not present

## 2023-06-08 DIAGNOSIS — E119 Type 2 diabetes mellitus without complications: Secondary | ICD-10-CM | POA: Diagnosis not present

## 2023-06-08 DIAGNOSIS — H43813 Vitreous degeneration, bilateral: Secondary | ICD-10-CM | POA: Diagnosis not present

## 2023-06-08 DIAGNOSIS — H04123 Dry eye syndrome of bilateral lacrimal glands: Secondary | ICD-10-CM | POA: Diagnosis not present

## 2023-07-25 DIAGNOSIS — E1165 Type 2 diabetes mellitus with hyperglycemia: Secondary | ICD-10-CM | POA: Diagnosis not present

## 2023-08-01 DIAGNOSIS — I440 Atrioventricular block, first degree: Secondary | ICD-10-CM | POA: Diagnosis not present

## 2023-08-01 DIAGNOSIS — R0602 Shortness of breath: Secondary | ICD-10-CM | POA: Diagnosis not present

## 2023-08-01 DIAGNOSIS — I10 Essential (primary) hypertension: Secondary | ICD-10-CM | POA: Diagnosis not present

## 2023-08-01 DIAGNOSIS — E785 Hyperlipidemia, unspecified: Secondary | ICD-10-CM | POA: Diagnosis not present

## 2023-08-01 DIAGNOSIS — R002 Palpitations: Secondary | ICD-10-CM | POA: Diagnosis not present

## 2023-08-01 DIAGNOSIS — E1169 Type 2 diabetes mellitus with other specified complication: Secondary | ICD-10-CM | POA: Diagnosis not present

## 2023-08-01 DIAGNOSIS — R413 Other amnesia: Secondary | ICD-10-CM | POA: Diagnosis not present

## 2023-08-01 DIAGNOSIS — N39498 Other specified urinary incontinence: Secondary | ICD-10-CM | POA: Diagnosis not present

## 2023-08-01 DIAGNOSIS — Q8901 Asplenia (congenital): Secondary | ICD-10-CM | POA: Diagnosis not present

## 2023-08-02 ENCOUNTER — Other Ambulatory Visit: Payer: Self-pay | Admitting: Family Medicine

## 2023-08-02 DIAGNOSIS — R413 Other amnesia: Secondary | ICD-10-CM

## 2023-08-13 DIAGNOSIS — E538 Deficiency of other specified B group vitamins: Secondary | ICD-10-CM | POA: Diagnosis not present

## 2023-08-20 DIAGNOSIS — E538 Deficiency of other specified B group vitamins: Secondary | ICD-10-CM | POA: Diagnosis not present

## 2023-08-27 DIAGNOSIS — E538 Deficiency of other specified B group vitamins: Secondary | ICD-10-CM | POA: Diagnosis not present

## 2023-08-28 DIAGNOSIS — R0602 Shortness of breath: Secondary | ICD-10-CM | POA: Diagnosis not present

## 2023-08-28 DIAGNOSIS — R002 Palpitations: Secondary | ICD-10-CM | POA: Diagnosis not present

## 2023-09-01 ENCOUNTER — Ambulatory Visit
Admission: RE | Admit: 2023-09-01 | Discharge: 2023-09-01 | Disposition: A | Discharge status: Home / Self Care | Payer: Medicare PPO | Source: Ambulatory Visit | Attending: Family Medicine | Admitting: Family Medicine

## 2023-09-01 DIAGNOSIS — R413 Other amnesia: Secondary | ICD-10-CM | POA: Diagnosis not present

## 2023-09-01 DIAGNOSIS — G319 Degenerative disease of nervous system, unspecified: Secondary | ICD-10-CM | POA: Diagnosis not present

## 2023-09-01 DIAGNOSIS — I6782 Cerebral ischemia: Secondary | ICD-10-CM | POA: Diagnosis not present

## 2023-09-01 MED ORDER — GADOPICLENOL 0.5 MMOL/ML IV SOLN
8.0000 mL | Freq: Once | INTRAVENOUS | Status: AC | PRN
  Administered 2023-09-01: 8 mL via INTRAVENOUS

## 2023-09-03 DIAGNOSIS — E538 Deficiency of other specified B group vitamins: Secondary | ICD-10-CM | POA: Diagnosis not present

## 2023-09-10 DIAGNOSIS — E538 Deficiency of other specified B group vitamins: Secondary | ICD-10-CM | POA: Diagnosis not present

## 2023-09-12 DIAGNOSIS — R0602 Shortness of breath: Secondary | ICD-10-CM | POA: Diagnosis not present

## 2023-09-12 DIAGNOSIS — F01A0 Vascular dementia, mild, without behavioral disturbance, psychotic disturbance, mood disturbance, and anxiety: Secondary | ICD-10-CM | POA: Diagnosis not present

## 2023-09-12 DIAGNOSIS — E538 Deficiency of other specified B group vitamins: Secondary | ICD-10-CM | POA: Diagnosis not present

## 2023-09-12 DIAGNOSIS — J309 Allergic rhinitis, unspecified: Secondary | ICD-10-CM | POA: Diagnosis not present

## 2023-09-17 DIAGNOSIS — E538 Deficiency of other specified B group vitamins: Secondary | ICD-10-CM | POA: Diagnosis not present

## 2023-09-24 DIAGNOSIS — E538 Deficiency of other specified B group vitamins: Secondary | ICD-10-CM | POA: Diagnosis not present

## 2023-10-08 DIAGNOSIS — E538 Deficiency of other specified B group vitamins: Secondary | ICD-10-CM | POA: Diagnosis not present

## 2023-10-22 DIAGNOSIS — E538 Deficiency of other specified B group vitamins: Secondary | ICD-10-CM | POA: Diagnosis not present

## 2023-10-30 DIAGNOSIS — E538 Deficiency of other specified B group vitamins: Secondary | ICD-10-CM | POA: Diagnosis not present

## 2023-11-05 DIAGNOSIS — E538 Deficiency of other specified B group vitamins: Secondary | ICD-10-CM | POA: Diagnosis not present

## 2023-11-19 DIAGNOSIS — E538 Deficiency of other specified B group vitamins: Secondary | ICD-10-CM | POA: Diagnosis not present

## 2023-12-11 DIAGNOSIS — F01A0 Vascular dementia, mild, without behavioral disturbance, psychotic disturbance, mood disturbance, and anxiety: Secondary | ICD-10-CM | POA: Diagnosis not present

## 2023-12-11 DIAGNOSIS — Z6828 Body mass index (BMI) 28.0-28.9, adult: Secondary | ICD-10-CM | POA: Diagnosis not present

## 2023-12-11 DIAGNOSIS — E538 Deficiency of other specified B group vitamins: Secondary | ICD-10-CM | POA: Diagnosis not present

## 2023-12-11 DIAGNOSIS — R413 Other amnesia: Secondary | ICD-10-CM | POA: Diagnosis not present

## 2023-12-11 DIAGNOSIS — E1169 Type 2 diabetes mellitus with other specified complication: Secondary | ICD-10-CM | POA: Diagnosis not present

## 2024-01-29 DIAGNOSIS — E559 Vitamin D deficiency, unspecified: Secondary | ICD-10-CM | POA: Diagnosis not present

## 2024-01-29 DIAGNOSIS — E785 Hyperlipidemia, unspecified: Secondary | ICD-10-CM | POA: Diagnosis not present

## 2024-01-29 DIAGNOSIS — E1165 Type 2 diabetes mellitus with hyperglycemia: Secondary | ICD-10-CM | POA: Diagnosis not present

## 2024-01-29 DIAGNOSIS — E538 Deficiency of other specified B group vitamins: Secondary | ICD-10-CM | POA: Diagnosis not present

## 2024-01-31 DIAGNOSIS — E1165 Type 2 diabetes mellitus with hyperglycemia: Secondary | ICD-10-CM | POA: Diagnosis not present

## 2024-01-31 DIAGNOSIS — F01A0 Vascular dementia, mild, without behavioral disturbance, psychotic disturbance, mood disturbance, and anxiety: Secondary | ICD-10-CM | POA: Diagnosis not present

## 2024-01-31 DIAGNOSIS — M85852 Other specified disorders of bone density and structure, left thigh: Secondary | ICD-10-CM | POA: Diagnosis not present

## 2024-01-31 DIAGNOSIS — Z6828 Body mass index (BMI) 28.0-28.9, adult: Secondary | ICD-10-CM | POA: Diagnosis not present

## 2024-01-31 DIAGNOSIS — I10 Essential (primary) hypertension: Secondary | ICD-10-CM | POA: Diagnosis not present

## 2024-01-31 DIAGNOSIS — E785 Hyperlipidemia, unspecified: Secondary | ICD-10-CM | POA: Diagnosis not present

## 2024-01-31 DIAGNOSIS — E538 Deficiency of other specified B group vitamins: Secondary | ICD-10-CM | POA: Diagnosis not present

## 2024-01-31 DIAGNOSIS — K8689 Other specified diseases of pancreas: Secondary | ICD-10-CM | POA: Diagnosis not present

## 2024-01-31 DIAGNOSIS — J309 Allergic rhinitis, unspecified: Secondary | ICD-10-CM | POA: Diagnosis not present

## 2024-03-05 DIAGNOSIS — Z23 Encounter for immunization: Secondary | ICD-10-CM | POA: Diagnosis not present

## 2024-03-05 DIAGNOSIS — Z1331 Encounter for screening for depression: Secondary | ICD-10-CM | POA: Diagnosis not present

## 2024-03-05 DIAGNOSIS — Z0000 Encounter for general adult medical examination without abnormal findings: Secondary | ICD-10-CM | POA: Diagnosis not present

## 2024-04-05 DIAGNOSIS — Z1231 Encounter for screening mammogram for malignant neoplasm of breast: Secondary | ICD-10-CM | POA: Diagnosis not present
